# Patient Record
Sex: Female | Born: 1955
Health system: Southern US, Community
[De-identification: ages and names within clinical notes are randomized; demographics above are authoritative.]

## PROBLEM LIST (undated history)

## (undated) DIAGNOSIS — I10 Essential (primary) hypertension: Secondary | ICD-10-CM

## (undated) DIAGNOSIS — E785 Hyperlipidemia, unspecified: Secondary | ICD-10-CM

## (undated) HISTORY — DX: Hyperlipidemia, unspecified: E78.5

---

## 1997-08-09 ENCOUNTER — Ambulatory Visit (HOSPITAL_COMMUNITY): Admission: RE | Admit: 1997-08-09 | Discharge: 1997-08-09 | Payer: Self-pay | Admitting: Obstetrics & Gynecology

## 1998-03-20 ENCOUNTER — Emergency Department (HOSPITAL_COMMUNITY): Admission: EM | Admit: 1998-03-20 | Discharge: 1998-03-20 | Payer: Self-pay | Admitting: Emergency Medicine

## 1998-03-20 ENCOUNTER — Encounter: Payer: Self-pay | Admitting: Emergency Medicine

## 1999-08-16 ENCOUNTER — Emergency Department (HOSPITAL_COMMUNITY): Admission: EM | Admit: 1999-08-16 | Discharge: 1999-08-16 | Payer: Self-pay | Admitting: Emergency Medicine

## 1999-10-10 ENCOUNTER — Ambulatory Visit (HOSPITAL_COMMUNITY): Admission: RE | Admit: 1999-10-10 | Discharge: 1999-10-10 | Payer: Self-pay | Admitting: Obstetrics & Gynecology

## 1999-10-10 ENCOUNTER — Encounter: Payer: Self-pay | Admitting: Obstetrics & Gynecology

## 2001-02-08 ENCOUNTER — Ambulatory Visit (HOSPITAL_COMMUNITY): Admission: RE | Admit: 2001-02-08 | Discharge: 2001-02-08 | Payer: Self-pay | Admitting: Obstetrics & Gynecology

## 2001-02-08 ENCOUNTER — Encounter: Payer: Self-pay | Admitting: Obstetrics & Gynecology

## 2004-05-28 ENCOUNTER — Ambulatory Visit: Payer: Self-pay | Admitting: Family Medicine

## 2004-06-18 ENCOUNTER — Ambulatory Visit: Payer: Self-pay | Admitting: *Deleted

## 2004-06-18 ENCOUNTER — Ambulatory Visit: Payer: Self-pay | Admitting: Family Medicine

## 2004-06-20 ENCOUNTER — Ambulatory Visit: Payer: Self-pay | Admitting: Family Medicine

## 2004-06-23 ENCOUNTER — Ambulatory Visit: Payer: Self-pay | Admitting: Family Medicine

## 2004-06-30 ENCOUNTER — Ambulatory Visit: Payer: Self-pay | Admitting: Family Medicine

## 2004-07-21 ENCOUNTER — Ambulatory Visit: Payer: Self-pay | Admitting: Family Medicine

## 2009-06-12 ENCOUNTER — Ambulatory Visit (HOSPITAL_COMMUNITY): Admission: RE | Admit: 2009-06-12 | Discharge: 2009-06-12 | Payer: Self-pay | Admitting: Family Medicine

## 2012-10-24 ENCOUNTER — Other Ambulatory Visit: Payer: Self-pay | Admitting: Obstetrics and Gynecology

## 2012-10-24 DIAGNOSIS — Z1231 Encounter for screening mammogram for malignant neoplasm of breast: Secondary | ICD-10-CM

## 2012-10-25 ENCOUNTER — Encounter (HOSPITAL_COMMUNITY): Payer: Self-pay

## 2012-10-25 ENCOUNTER — Ambulatory Visit (HOSPITAL_COMMUNITY)
Admission: RE | Admit: 2012-10-25 | Discharge: 2012-10-25 | Disposition: A | Discharge status: Home / Self Care | Payer: Self-pay | Source: Ambulatory Visit | Attending: Obstetrics and Gynecology | Admitting: Obstetrics and Gynecology

## 2012-10-25 ENCOUNTER — Encounter (HOSPITAL_COMMUNITY): Payer: Self-pay | Admitting: Emergency Medicine

## 2012-10-25 ENCOUNTER — Emergency Department (HOSPITAL_COMMUNITY)
Admission: EM | Admit: 2012-10-25 | Discharge: 2012-10-25 | Discharge status: Against Medical Advice | Payer: Self-pay | Attending: Emergency Medicine | Admitting: Emergency Medicine

## 2012-10-25 VITALS — BP 256/146 | Temp 98.4°F | Ht 70.0 in | Wt 210.6 lb

## 2012-10-25 DIAGNOSIS — Z1231 Encounter for screening mammogram for malignant neoplasm of breast: Secondary | ICD-10-CM

## 2012-10-25 DIAGNOSIS — Z01419 Encounter for gynecological examination (general) (routine) without abnormal findings: Secondary | ICD-10-CM

## 2012-10-25 DIAGNOSIS — I10 Essential (primary) hypertension: Secondary | ICD-10-CM | POA: Insufficient documentation

## 2012-10-25 HISTORY — DX: Essential (primary) hypertension: I10

## 2012-10-25 NOTE — ED Notes (Signed)
Called no answer

## 2012-10-25 NOTE — Progress Notes (Signed)
No complaints today.  Pap Smear:    Pap smear completed today. Per patient last Pap smear was 5 years ago and normal. Per patient has no history of an abnormal Pap smear. No Pap smear results in EPIC.  Physical exam: Breasts Left breast larger than right breast. No skin abnormalities bilateral breasts. No nipple retraction bilateral breasts. No nipple discharge bilateral breasts. No lymphadenopathy. No lumps palpated bilateral breasts. No complaints of pain or tenderness on exam. Patient escorted to mammography for a screening mammogram.          Pelvic/Bimanual   Ext Genitalia No lesions, no swelling and no discharge observed on external genitalia.         Vagina Vagina pink and normal texture. No lesions or discharge observed in vagina.          Cervix Cervix is present. Cervix pink and of normal texture. Cervical polyp on cervix. No discharge observed.     Uterus Uterus is present and palpable. Uterus in normal position and normal size.        Adnexae Bilateral ovaries present and palpable. No tenderness on palpation.          Rectovaginal No rectal exam completed today since patient had no rectal complaints. No skin abnormalities observed on exam.

## 2012-10-25 NOTE — Patient Instructions (Addendum)
Taught patient how to perform BSE. Let her know [facility] will cover Pap smears every 3 years unless has a history of abnormal Pap smears. Let patient know will follow up with her within the next couple weeks with results by letter or phone. Told patient to go to the ED at [facility] due to her BP being highly elevated. BP was 256/146 and 280/148. Let patient know have called report to [facility] ED and that they are expecting her. Told her importance of following up on her elevated BP. Patient needs to follow up at [facility] to have a cervical Polyp removed. Told her will discuss when call with her results to Pap smear. Patient verbalized understanding.

## 2012-10-25 NOTE — ED Notes (Addendum)
Called x 3, no answer.  

## 2012-10-25 NOTE — ED Notes (Signed)
Pt arrives sent over from [facility] for pts HTN noted prior to getting a mamogram done.  Pt reports not taking medications for HTN since she doesn't have any health insurance to see a doctor. Denies headache, blurred vision, dizziness. Pt alert oriented x4, NAD at present.

## 2012-11-17 ENCOUNTER — Telehealth (HOSPITAL_COMMUNITY): Payer: Self-pay | Admitting: *Deleted

## 2012-11-17 NOTE — Telephone Encounter (Signed)
Telephoned patient at home # and left message to return call to [facility]

## 2012-11-18 ENCOUNTER — Telehealth (HOSPITAL_COMMUNITY): Payer: Self-pay | Admitting: *Deleted

## 2012-11-18 NOTE — Telephone Encounter (Signed)
Telephoned patient at home # and left message to return call to [facility] 

## 2012-11-21 ENCOUNTER — Telehealth (HOSPITAL_COMMUNITY): Payer: Self-pay | Admitting: *Deleted

## 2012-11-21 NOTE — Telephone Encounter (Signed)
Telephoned patient at home # and left message. Also left message with emergency contact.

## 2012-11-22 ENCOUNTER — Telehealth (HOSPITAL_COMMUNITY): Payer: Self-pay | Admitting: *Deleted

## 2012-11-22 NOTE — Telephone Encounter (Signed)
Telephoned patient at home # and advised of normal pap smear results. Next pap due in 3 years. Gave patient appointment at [facility] for Thursday July 31 2:45. Patient voiced understanding.

## 2012-12-22 ENCOUNTER — Encounter: Payer: Self-pay | Admitting: Obstetrics & Gynecology

## 2013-02-14 ENCOUNTER — Ambulatory Visit: Payer: Self-pay | Admitting: Internal Medicine

## 2013-02-20 ENCOUNTER — Ambulatory Visit (INDEPENDENT_AMBULATORY_CARE_PROVIDER_SITE_OTHER): Payer: Managed Care, Other (non HMO) | Admitting: Internal Medicine

## 2013-02-20 ENCOUNTER — Other Ambulatory Visit (INDEPENDENT_AMBULATORY_CARE_PROVIDER_SITE_OTHER): Payer: Managed Care, Other (non HMO)

## 2013-02-20 ENCOUNTER — Encounter: Payer: Self-pay | Admitting: Internal Medicine

## 2013-02-20 VITALS — BP 160/100 | HR 82 | Temp 98.6°F | Ht 71.0 in | Wt 207.0 lb

## 2013-02-20 DIAGNOSIS — Z13220 Encounter for screening for lipoid disorders: Secondary | ICD-10-CM

## 2013-02-20 DIAGNOSIS — Z0000 Encounter for general adult medical examination without abnormal findings: Secondary | ICD-10-CM

## 2013-02-20 DIAGNOSIS — I10 Essential (primary) hypertension: Secondary | ICD-10-CM

## 2013-02-20 DIAGNOSIS — Z130 Encounter for screening for diseases of the blood and blood-forming organs and certain disorders involving the immune mechanism: Secondary | ICD-10-CM

## 2013-02-20 DIAGNOSIS — Z1321 Encounter for screening for nutritional disorder: Secondary | ICD-10-CM

## 2013-02-20 DIAGNOSIS — Z131 Encounter for screening for diabetes mellitus: Secondary | ICD-10-CM

## 2013-02-20 DIAGNOSIS — Z23 Encounter for immunization: Secondary | ICD-10-CM

## 2013-02-20 DIAGNOSIS — R011 Cardiac murmur, unspecified: Secondary | ICD-10-CM

## 2013-02-20 DIAGNOSIS — Z1329 Encounter for screening for other suspected endocrine disorder: Secondary | ICD-10-CM

## 2013-02-20 LAB — CBC
HCT: 36.5 % (ref 36.0–46.0)
Hemoglobin: 12.3 g/dL (ref 12.0–15.0)
MCHC: 33.6 g/dL (ref 30.0–36.0)
MCV: 88.9 fl (ref 78.0–100.0)
Platelets: 153 10*3/uL (ref 150.0–400.0)
RBC: 4.1 Mil/uL (ref 3.87–5.11)
RDW: 14.6 % (ref 11.5–14.6)
WBC: 4.4 10*3/uL — ABNORMAL LOW (ref 4.5–10.5)

## 2013-02-20 LAB — COMPREHENSIVE METABOLIC PANEL
ALT: 15 U/L (ref 0–35)
AST: 21 U/L (ref 0–37)
Albumin: 4 g/dL (ref 3.5–5.2)
Alkaline Phosphatase: 64 U/L (ref 39–117)
BUN: 18 mg/dL (ref 6–23)
CO2: 29 mEq/L (ref 19–32)
Calcium: 9.2 mg/dL (ref 8.4–10.5)
Chloride: 107 mEq/L (ref 96–112)
Creatinine, Ser: 0.8 mg/dL (ref 0.4–1.2)
GFR: 89.61 mL/min (ref >60.00)
Glucose, Bld: 90 mg/dL (ref 70–99)
Potassium: 4.1 mEq/L (ref 3.5–5.1)
Sodium: 140 mEq/L (ref 135–145)
Total Bilirubin: 0.5 mg/dL (ref 0.3–1.2)
Total Protein: 7.4 g/dL (ref 6.0–8.3)

## 2013-02-20 LAB — LIPID PANEL
Cholesterol: 183 mg/dL (ref 0–200)
HDL: 46.2 mg/dL (ref >39.00)
LDL Cholesterol: 123 mg/dL — ABNORMAL HIGH (ref 0–99)
Total CHOL/HDL Ratio: 4
Triglycerides: 71 mg/dL (ref 0.0–149.0)
VLDL: 14.2 mg/dL (ref 0.0–40.0)

## 2013-02-20 LAB — TSH: TSH: 0.78 u[IU]/mL (ref 0.35–5.50)

## 2013-02-20 LAB — HEMOGLOBIN A1C: Hgb A1c MFr Bld: 5.7 % (ref 4.6–6.5)

## 2013-02-20 MED ORDER — LOSARTAN POTASSIUM-HCTZ 50-12.5 MG PO TABS: 1.0000 | ORAL_TABLET | Freq: Every day | ORAL | Status: DC

## 2013-02-20 NOTE — Progress Notes (Signed)
HPI Pt presents to the clinic today to establish care. She is not transferring care from another provider. She has not had insurance in a few years. She has no concerns today.  Flu: never, but does plan to get one today Tetanus: not within last 10 years LMP: postmenaousal Pap smear: 10/2012 Mammogram: 10/2012 Colonoscopy: never had one Eye doctor: once per year Dentist: scheduled  Past Medical History  Diagnosis Date  . Hypertension   . Hyperlipidemia     No current outpatient prescriptions on file.   No current facility-administered medications for this visit.    No Known Allergies  Family History  Problem Relation Age of Onset  . Hypertension Mother   . Hypertension Maternal Grandmother   . Hypertension Maternal Grandfather     History   Social History  . Marital Status: Divorced    Spouse Name: N/A    Number of Children: N/A  . Years of Education: N/A   Occupational History  . Not on file.   Social History Main Topics  . Smoking status: Never Smoker   . Smokeless tobacco: Never Used  . Alcohol Use: No  . Drug Use: No  . Sexual Activity: Not Currently   Other Topics Concern  . Not on file   Social History Narrative  . No narrative on file    ROS:  Constitutional: Denies fever, malaise, fatigue, headache or abrupt weight changes.  HEENT: Denies eye pain, eye redness, ear pain, ringing in the ears, wax buildup, runny nose, nasal congestion, bloody nose, or sore throat. Respiratory: Denies difficulty breathing, shortness of breath, cough or sputum production.   Cardiovascular: Denies chest pain, chest tightness, palpitations or swelling in the hands or feet.  Gastrointestinal: Denies abdominal pain, bloating, constipation, diarrhea or blood in the stool.  GU: Denies frequency, urgency, pain with urination, blood in urine, odor or discharge. Musculoskeletal: Denies decrease in range of motion, difficulty with gait, muscle pain or joint pain and swelling.   Skin: Denies redness, rashes, lesions or ulcercations.  Neurological: Denies dizziness, difficulty with memory, difficulty with speech or problems with balance and coordination.   No other specific complaints in a complete review of systems (except as listed in HPI above).  PE:  BP 160/100  Pulse 82  Temp(Src) 98.6 F (37 C) (Oral)  Ht 5\' 11"  (1.803 m)  Wt 207 lb (93.895 kg)  BMI 28.88 kg/m2  SpO2 97% Wt Readings from Last 3 Encounters:  02/20/13 207 lb (93.895 kg)  10/25/12 210 lb 9.6 oz (95.528 kg)    General: Appears her stated age, overweight but well developed, well nourished in NAD. HEENT: Head: normal shape and size; Eyes: sclera white, no icterus, conjunctiva pink, PERRLA and EOMs intact; Ears: Tm's gray and intact, normal light reflex; Nose: mucosa pink and moist, septum midline; Throat/Mouth: Teeth present, mucosa pink and moist, no lesions or ulcerations noted.  Neck: Normal range of motion. Neck supple, trachea midline. No massses, lumps or thyromegaly present.  Cardiovascular: Normal rate and rhythm. S1,S2 noted.  Murmur noted, no rubs or gallops noted. Marked JVD noted on the right side. No BLE edema. No carotid bruits noted. Pulmonary/Chest: Normal effort and positive vesicular breath sounds. No respiratory distress. No wheezes, rales or ronchi noted.  Abdomen: Soft and nontender. Normal bowel sounds, no bruits noted. No distention or masses noted. Liver, spleen and kidneys non palpable. Musculoskeletal: Normal range of motion. No signs of joint swelling. No difficulty with gait.  Neurological: Alert and oriented. Cranial  nerves II-XII intact. Coordination normal. +DTRs bilaterally. Psychiatric: Mood and affect normal. Behavior is normal. Judgment and thought content normal.     Assessment and Plan:  Prevent health:  Flu and tdap given today Will obtain basic screening labs

## 2013-02-20 NOTE — Assessment & Plan Note (Signed)
With marked JVD on right side Will obtain 2D echo  May need referral to cardiology

## 2013-02-20 NOTE — Assessment & Plan Note (Signed)
Elevated Not currently medicated Will start Hyzaar  RTC in 1 month for follow up

## 2013-02-20 NOTE — Patient Instructions (Signed)

## 2013-02-21 ENCOUNTER — Other Ambulatory Visit: Payer: Self-pay | Admitting: Internal Medicine

## 2013-02-21 LAB — VITAMIN D 25 HYDROXY (VIT D DEFICIENCY, FRACTURES): Vit D, 25-Hydroxy: 13 ng/mL — ABNORMAL LOW (ref 30–89)

## 2013-02-21 MED ORDER — VITAMIN D (ERGOCALCIFEROL) 1.25 MG (50000 UNIT) PO CAPS: 50000.0000 [IU] | ORAL_CAPSULE | ORAL | Status: DC

## 2013-03-20 ENCOUNTER — Ambulatory Visit: Payer: Managed Care, Other (non HMO) | Admitting: Internal Medicine

## 2013-03-20 DIAGNOSIS — Z0289 Encounter for other administrative examinations: Secondary | ICD-10-CM

## 2013-04-27 ENCOUNTER — Encounter: Payer: Self-pay | Admitting: Interventional Cardiology

## 2013-04-28 ENCOUNTER — Encounter: Payer: Self-pay | Admitting: Interventional Cardiology

## 2013-04-28 ENCOUNTER — Ambulatory Visit (INDEPENDENT_AMBULATORY_CARE_PROVIDER_SITE_OTHER): Payer: Managed Care, Other (non HMO) | Admitting: Interventional Cardiology

## 2013-04-28 ENCOUNTER — Encounter (INDEPENDENT_AMBULATORY_CARE_PROVIDER_SITE_OTHER): Payer: Self-pay

## 2013-04-28 VITALS — BP 144/72 | HR 74 | Ht 71.0 in | Wt 203.8 lb

## 2013-04-28 DIAGNOSIS — I359 Nonrheumatic aortic valve disorder, unspecified: Secondary | ICD-10-CM

## 2013-04-28 DIAGNOSIS — E785 Hyperlipidemia, unspecified: Secondary | ICD-10-CM

## 2013-04-28 DIAGNOSIS — I10 Essential (primary) hypertension: Secondary | ICD-10-CM

## 2013-04-28 NOTE — Progress Notes (Signed)
Patient ID: Arine Foley, female   DOB: 30-May-1955, 57 y.o.   MRN: [id number]   Date: 04/28/2013 ID: Lasandra Beech, DOB [birth year], MRN [id number] PCP: Jinny Blossom Reason: Murmur  ASSESSMENT;  1. Aortic stenosis is the most likely source of the patient's murmur. Cannot exclude the possibility of congenital VSD. 2. Hypertension, marginal control 3. Obesity 4. Hyperlipidemia  PLAN:  1. 2-D Doppler echocardiogram with further instructions pending results.   SUBJECTIVE: Eathel Pajak is a 57 y.o. female who is a very pleasant 56 year old African American female who is referred by Dr. Alfredia Ferguson for evaluation of systolic murmur. The patient states that she was informed of having a heart murmur within the last 5-10 years. He has never been evaluated because she has not had health insurance will allow her to afford the workup. She does have exertional dyspnea. She feels that it is appropriate for her level of conditioning. She denies chest discomfort, orthopnea, PND, edema, and syncope. She has no other significant medical problems and hypertension.   No Known Allergies  Current Outpatient Prescriptions on File Prior to Visit  Medication Sig Dispense Refill  . losartan-hydrochlorothiazide (HYZAAR) 50-12.5 MG per tablet Take 1 tablet by mouth daily.  30 tablet  3   No current facility-administered medications on file prior to visit.    Past Medical History  Diagnosis Date  . Hypertension   . Hyperlipidemia     No past surgical history on file.  History   Social History  . Marital Status: Divorced    Spouse Name: N/A    Number of Children: N/A  . Years of Education: N/A   Occupational History  . Not on file.   Social History Main Topics  . Smoking status: Never Smoker   . Smokeless tobacco: Never Used  . Alcohol Use: No  . Drug Use: No  . Sexual Activity: Not Currently   Other Topics Concern  . Not on file   Social History Narrative  . No narrative on file    Family  History  Problem Relation Age of Onset  . Hypertension Mother   . Hypertension Maternal Grandmother   . Hypertension Maternal Grandfather     ROS: Denies stroke, asthma, liver disease, ulcer, intestinal bleeding, kidney disease, diabetes, rheumatic fever, and weight loss.. Other systems negative for complaints.  OBJECTIVE: BP 144/72  Pulse 74  Ht 5\' 11"  (1.803 m)  Wt 203 lb 12.8 oz (92.443 kg)  BMI 28.44 kg/m2,   General: No acute distress, appearing younger than stated age HEENT: normal Neck: JVD flat. Carotids right greater than left bruit transmitted from aorta Chest: Clear to patient and percussion Cardiac: Murmur: 3/6 crescendo decrescendo murmur of aortic stenosis. No change with maneuvers.. Gallop: S4 gallop. Rhythm: Regular. Other: Unable to palpate PMI Abdomen: Bruit: Absent. Pulsation: Absent Extremities: Edema: Absent. Pulses: 2+ and bounding Neuro: Normal Psych: Marked anxiety  ECG: Normal sinus rhythm with LVH and strain.

## 2013-04-28 NOTE — Patient Instructions (Addendum)
Your physician recommends that you continue on your current medications as directed. Please refer to the Current Medication list given to you today.  Your physician has requested that you have an echocardiogram. Echocardiography is a painless test that uses sound waves to create images of your heart. It provides your doctor with information about the size and shape of your heart and how well your heart's chambers and valves are working. This procedure takes approximately one hour. There are no restrictions for this procedure. Echo scheduled 05/16/13 @10 :30am  Follow up pending echo results

## 2013-05-16 ENCOUNTER — Encounter: Payer: Self-pay | Admitting: Cardiology

## 2013-05-16 ENCOUNTER — Ambulatory Visit (HOSPITAL_COMMUNITY): Payer: Managed Care, Other (non HMO) | Attending: Internal Medicine | Admitting: Radiology

## 2013-05-16 DIAGNOSIS — I10 Essential (primary) hypertension: Secondary | ICD-10-CM | POA: Insufficient documentation

## 2013-05-16 DIAGNOSIS — E785 Hyperlipidemia, unspecified: Secondary | ICD-10-CM | POA: Insufficient documentation

## 2013-05-16 DIAGNOSIS — R011 Cardiac murmur, unspecified: Secondary | ICD-10-CM | POA: Insufficient documentation

## 2013-05-16 DIAGNOSIS — I359 Nonrheumatic aortic valve disorder, unspecified: Secondary | ICD-10-CM | POA: Insufficient documentation

## 2013-05-16 NOTE — Progress Notes (Signed)
Echocardiogram performed.  

## 2013-05-22 ENCOUNTER — Telehealth: Payer: Self-pay

## 2013-05-22 NOTE — Telephone Encounter (Signed)
Message copied by Jarvis Newcomer on Mon May 22, 2013  4:02 PM ------      Message from: Verdis Prime      Created: Tue May 16, 2013  5:46 PM       Dynamic LV outflow obstruction with gradient > 100 mmHg. Need to add Metoprolol succinate 50 mg daily. Will eventually try to get rid of the fluid medicine in her other BP med. Needs a f/u appt in 2 months. ------

## 2013-05-22 NOTE — Telephone Encounter (Signed)
called to give pt echo results and Dr.Smith instructions.lmom for pt to return call

## 2013-05-22 NOTE — Telephone Encounter (Signed)
Follow Up  Pt returned call// Will not be free until 6:30// Please call back please and leave a message.

## 2013-05-23 MED ORDER — METOPROLOL SUCCINATE ER 50 MG PO TB24: 50.0000 mg | ORAL_TABLET | Freq: Every day | ORAL | Status: DC

## 2013-05-23 NOTE — Telephone Encounter (Signed)
Message copied by Jarvis Newcomer on Tue May 23, 2013 10:37 AM ------      Message from: Verdis Prime      Created: Tue May 16, 2013  5:46 PM       Dynamic LV outflow obstruction with gradient > 100 mmHg. Need to add Metoprolol succinate 50 mg daily. Will eventually try to get rid of the fluid medicine in her other BP med. Needs a f/u appt in 2 months. ------

## 2013-05-23 NOTE — Telephone Encounter (Signed)
2nd attempt. pt sts ok to leave detailed message.echo results.Dynamic LV outflow obstruction with gradient > 100 mmHg. Need to add Metoprolol succinate 50 mg daily.Rx sent to pharmacy. Will eventually try to get rid of the fluid medicine in her other BP med. Needs a f/u app in 2 months.pt to call the office with any additional questions and to schedule f/u appt.

## 2013-05-24 ENCOUNTER — Telehealth: Payer: Self-pay | Admitting: Interventional Cardiology

## 2013-05-24 NOTE — Telephone Encounter (Signed)
returned pt call.pt adv that Dr.Smith is aware that she currently taking Hyzaar and and he would like her to start metoprolol in addition to Hyzaar.pt verbalized understanding

## 2013-05-24 NOTE — Telephone Encounter (Signed)
New Message  Pt returning your call// Medication inquiry about BP meds/// she asks if Metoprolol can be taken with other BP medications.. Please call.

## 2013-07-21 ENCOUNTER — Encounter: Payer: Self-pay | Admitting: Interventional Cardiology

## 2013-07-21 ENCOUNTER — Ambulatory Visit (INDEPENDENT_AMBULATORY_CARE_PROVIDER_SITE_OTHER): Payer: BC Managed Care – PPO | Admitting: Interventional Cardiology

## 2013-07-21 VITALS — BP 160/99 | HR 83 | Ht 71.0 in | Wt 202.0 lb

## 2013-07-21 DIAGNOSIS — E785 Hyperlipidemia, unspecified: Secondary | ICD-10-CM

## 2013-07-21 DIAGNOSIS — I10 Essential (primary) hypertension: Secondary | ICD-10-CM

## 2013-07-21 DIAGNOSIS — I421 Obstructive hypertrophic cardiomyopathy: Secondary | ICD-10-CM

## 2013-07-21 NOTE — Patient Instructions (Signed)
Your physician recommends that you continue on your current medications as directed. Please refer to the Current Medication list given to you today.  Call the office if you ae experiencing the following symptoms: shortness of breath, chest pain, syncope(fainting)  Your physician wants you to follow-up in: 6 months You will receive a reminder letter in the mail two months in advance. If you don't receive a letter, please call our office to schedule the follow-up appointment.

## 2013-07-21 NOTE — Progress Notes (Signed)
     [address] [place], [place]  [address] Phone: [phone] Fax:  [phone]  Date:  07/21/2013   ID:  Heather Abbott, DOB 12/27/1955, MRN [id number]  PCP:  Nicki ReaperBAITY, REGINA, NP   ASSESSMENT:  1. Hypertrophic obstructive cardiomyopathy, asymptomatic, manifesting clinically has a loud systolic murmur. Documented by echocardiography in late 2014 2. Hypertension, poor control 3. Hyperlipidemia   PLAN:  1. Beta blocker therapy was added and has been well tolerated. 2. We talked about the pathophysiology and natural history including symptoms related to HOCM 3. She understands that we will probably need to remove her current diuretic, but we decided at this time since she is asymptomatic to continue her birth same therapy. I have recommended that she take both Hyzaar and metoprolol each morning rather than taking the Hyzaar at night. 4. She understands that she should call if dyspnea, chest pain, or syncope per   SUBJECTIVE: Heather Abbott is a 58 y.o. female who has no complaints. She saw me because of a heart murmur. I initially felt that she had aortic stenosis. The echo demonstrated that she has LV outflow tract obstruction there is dynamic. She does have concentric hypertrophy. The measured LV outflow velocity is 5 m/s. Over the past 3-5 days she has experienced some early morning chest burning. There is no exertional component.. She denies dyspnea, orthopnea, PND, orthopnea, syncope, PND, and palpitations   Wt Readings from Last 3 Encounters:  07/21/13 202 lb (91.627 kg)  04/28/13 203 lb 12.8 oz (92.443 kg)  02/20/13 207 lb (93.895 kg)     Past Medical History  Diagnosis Date  . Hypertension   . Hyperlipidemia     Current Outpatient Prescriptions  Medication Sig Dispense Refill  . losartan-hydrochlorothiazide (HYZAAR) 100-12.5 MG per tablet Take 1 tablet by mouth daily.      . metoprolol succinate (TOPROL-XL) 50 MG 24 hr tablet Take 1 tablet (50 mg total)  by mouth daily. Take with or immediately following a meal.  30 tablet  11   No current facility-administered medications for this visit.    Allergies:   No Known Allergies  Social History:  The patient  reports that she has never smoked. She has never used smokeless tobacco. She reports that she does not drink alcohol or use illicit drugs.   ROS:  Please see the history of present illness.   Stable appetite, denies chills and fever, no edema, never had syncope per   All other systems reviewed and negative.   OBJECTIVE: VS:  BP 160/99  Pulse 83  Ht 5\' 11"  (1.803 m)  Wt 202 lb (91.627 kg)  BMI 28.19 kg/m2 Well nourished, well developed, in no acute distress, younger than stated age HEENT: normal Neck: JVD flat. Carotid bruit absent  Cardiac:  normal S1, S2; RRR; 3/6 crescendo left mid parasternal border systolic murmu related to LV outflow obstruction. With water from sitting to standing the murmur increases in intensity. An S4 gallop is also audible. Lungs:  clear to auscultation bilaterally, no wheezing, rhonchi or rales Abd: soft, nontender, no hepatomegaly Ext: Edema absent. Pulses 2+ and symmetric Skin: warm and dry Neuro:  CNs 2-12 intact, no focal abnormalities noted  EKG:  Not repeated   . Prior tracing reveals left ventricular hypertrophy with strain.    Signed, Darci NeedleHenry W. B. Smith III, MD 07/21/2013 4:00 PM

## 2013-07-31 ENCOUNTER — Telehealth: Payer: Self-pay | Admitting: Interventional Cardiology

## 2013-07-31 NOTE — Telephone Encounter (Signed)
New Message  Pt called back to inform Dr. Katrinka BlazingSmith that her sympotoms have not changed and she is requesting a call back to discuss BP medication adjustments // Please assist//SR

## 2013-08-01 NOTE — Telephone Encounter (Signed)
returned pt call.lmom  for pt to call back 

## 2013-08-08 ENCOUNTER — Telehealth: Payer: Self-pay | Admitting: Interventional Cardiology

## 2013-08-08 NOTE — Telephone Encounter (Signed)
Follow up     Patient calling asking does Dr. Katrinka BlazingSmith think she need to come in for an appointment.    Patient stated A lot of times at work  she does not answer her phone.     Can leave a voice mail.

## 2013-08-08 NOTE — Telephone Encounter (Signed)
returned pt call.pt sts that she see had an appt with her pcp yesterday for dyspnea.pt sts that she had lab work and an chest xray.she sts she was told by her pcp she has a lung infection and is at risk for "heart failure" she sts there was a message left on her machine for her to go to the ED, she did not.Marland Kitchen.adv her that Dr.Nnody did call the office late yesterday afternoon to talk with Dr.Smith who had not arrived to the office yet.she wanted to make sure that Dr.Smith was in the loop.adv her that Dr.Smith will not be in the office until late this afternoon.she should follow the directions of Dr.Nnody and call their office to talk with her because Dr.Smith is out of the office..I will fwd a message to Dr.Smith.pt verbalized understanding.

## 2013-08-08 NOTE — Telephone Encounter (Signed)
detailed message lmom.pt appt made to see Dr.Smithn on 08/15/13 @8 :45.pt to call if she cannot keep the appt, and if symptoms get worse.

## 2013-08-08 NOTE — Telephone Encounter (Signed)
New message     Pt has a lung inf and is being treated by Dr Ihor DowNnodi at [facility] [place].  Labs revealed that the patient is at risk for heart failure. Want to talk to Dr Katrinka BlazingSmith or a nurse.

## 2013-08-15 ENCOUNTER — Encounter: Payer: Self-pay | Admitting: Interventional Cardiology

## 2013-08-15 ENCOUNTER — Ambulatory Visit (INDEPENDENT_AMBULATORY_CARE_PROVIDER_SITE_OTHER): Payer: BC Managed Care – PPO | Admitting: Interventional Cardiology

## 2013-08-15 VITALS — BP 150/98 | HR 82 | Ht 71.0 in | Wt 206.4 lb

## 2013-08-15 DIAGNOSIS — I5032 Chronic diastolic (congestive) heart failure: Secondary | ICD-10-CM | POA: Insufficient documentation

## 2013-08-15 DIAGNOSIS — I421 Obstructive hypertrophic cardiomyopathy: Secondary | ICD-10-CM

## 2013-08-15 DIAGNOSIS — I10 Essential (primary) hypertension: Secondary | ICD-10-CM

## 2013-08-15 DIAGNOSIS — E785 Hyperlipidemia, unspecified: Secondary | ICD-10-CM

## 2013-08-15 MED ORDER — VERAPAMIL HCL ER 180 MG PO TBCR: 180.0000 mg | EXTENDED_RELEASE_TABLET | Freq: Every day | ORAL | Status: DC

## 2013-08-15 MED ORDER — METOPROLOL SUCCINATE ER 25 MG PO TB24: 25.0000 mg | ORAL_TABLET | Freq: Every day | ORAL | Status: DC

## 2013-08-15 NOTE — Progress Notes (Signed)
Patient ID: Heather Abbott Leonhart, female   DOB: 02/25/1956, 58 y.o.   MRN: [id number]    [address] [place], [place]  [address] Phone: [phone] Fax:  [phone]  Date:  08/15/2013   ID:  Heather Abbott Luhmann, DOB 07/08/1955, MRN [id number]  PCP:  Nicki ReaperBAITY, REGINA, NP   ASSESSMENT:  1. Hypertrophic cardiomyopathy 2. Diastolic heart failure, aggravated by discontinuation of hydrochlorothiazide and after the addition of metoprolol 3. Hypertension, poorly controlled  PLAN:  1. Hyzaar was resumed greater than 10 days ago after briefly being discontinued 2. Decrease metoprolol succinate 25 mg daily and then discontinue after one week 3. Verapamil SR 180 mg daily 4. Office visit in 4-6 weeks to followup on blood pressure and clinical condition   SUBJECTIVE: Heather Abbott Barbar is a 58 y.o. female who has hypertrophic cardiomyopathy and poorly controlled or pressure. She has LV outflow tract obstruction. We made medication adjustments to fit her physiology. We added metoprolol succinate 50 mg per day. I mentioned discontinuing her diuretic. This was done by Dr.Knodi. After HCTZ was discontinued lower extremity swelling, orthopnea, and dyspnea on exertion began. Approximately one week ago we resumed HCTZ 12.5 mg per day and the dyspnea and lower extremity swelling has gradually improved although she is not back to baseline. She denies wheezing. She has not had palpitations or syncope.   Wt Readings from Last 3 Encounters:  08/15/13 206 lb 6.4 oz (93.622 kg)  07/21/13 202 lb (91.627 kg)  04/28/13 203 lb 12.8 oz (92.443 kg)     Past Medical History  Diagnosis Date  . Hypertension   . Hyperlipidemia     Current Outpatient Prescriptions  Medication Sig Dispense Refill  . Albuterol Sulfate (PROAIR HFA IN) Inhale into the lungs.      Marland Kitchen. losartan-hydrochlorothiazide (HYZAAR) 100-25 MG per tablet Take 1 tablet by mouth daily.      . metoprolol succinate (TOPROL-XL) 50 MG 24 hr tablet Take 1  tablet (50 mg total) by mouth daily. Take with or immediately following a meal.  30 tablet  11   No current facility-administered medications for this visit.    Allergies:   No Known Allergies  Social History:  The patient  reports that she has never smoked. She has never used smokeless tobacco. She reports that she does not drink alcohol or use illicit drugs.   ROS:  Please see the history of present illness.      All other systems reviewed and negative.   OBJECTIVE: VS:  BP 150/98  Pulse 82  Ht 5\' 11"  (1.803 m)  Wt 206 lb 6.4 oz (93.622 kg)  BMI 28.80 kg/m2 Well nourished, well developed, in no acute distress, appears than his stated age HEENT: normal Neck: JVD flat. Carotid bruit absent  Cardiac:  normal S1, S2; RRR; 3/6 systolic murmur left midsternal border Lungs:  clear to auscultation bilaterally, no wheezing, rhonchi or rales Abd: soft, nontender, no hepatomegaly Ext: Edema absent. Pulses 2+ and symmetric Skin: warm and dry Neuro:  CNs 2-12 intact, no focal abnormalities noted  EKG: Normal sinus rhythm with marked left ventricular hypertrophy and secondary repolarization abnormality. Left atrial abnormality is also noted to    Signed, Darci NeedleHenry W. B. Vallery Mcdade III, MD 08/15/2013 9:01 AM

## 2013-08-15 NOTE — Patient Instructions (Signed)
Your physician has recommended you make the following change in your medication:  1)DECREASE Metoprolol to 25mg  daily for 1 week then stop 2)START Verapamil 180mg  CR daily after discontinuing Metoprolol. An Rx has been sent to your pharmacy Take all other medications as prescribed  You have a follow up appt scheduled for 09/19/13 @11 :15am

## 2013-09-19 ENCOUNTER — Encounter: Payer: Self-pay | Admitting: Interventional Cardiology

## 2013-09-19 ENCOUNTER — Ambulatory Visit (INDEPENDENT_AMBULATORY_CARE_PROVIDER_SITE_OTHER): Payer: BC Managed Care – PPO | Admitting: Interventional Cardiology

## 2013-09-19 VITALS — BP 142/85 | HR 69 | Ht 71.0 in | Wt 203.0 lb

## 2013-09-19 DIAGNOSIS — I421 Obstructive hypertrophic cardiomyopathy: Secondary | ICD-10-CM

## 2013-09-19 DIAGNOSIS — I10 Essential (primary) hypertension: Secondary | ICD-10-CM

## 2013-09-19 DIAGNOSIS — R002 Palpitations: Secondary | ICD-10-CM

## 2013-09-19 DIAGNOSIS — I5032 Chronic diastolic (congestive) heart failure: Secondary | ICD-10-CM

## 2013-09-19 NOTE — Progress Notes (Signed)
Patient ID: Heather Abbott, female   DOB: 07/24/1955, 58 y.o.   MRN: [id number]    [address] [place], [place]  [address] Phone: [phone] Fax:  [phone]  Date:  09/19/2013   ID:  Heather Abbott, DOB 10/22/1955, MRN [id number]  PCP:  Nicki ReaperBAITY, REGINA, NP   ASSESSMENT:  1. Hypertrophic cardiomyopathy 2. Palpitations 3. Hypertension  PLAN:  1. tolerating verapamil without complications 2. 48-hour Holter to rule out ventricular arrhythmia given the complaint of palpitations 3. Blood pressure is better controlled. 4. Clinical followup in 6 months   SUBJECTIVE: Heather Abbott is a 58 y.o. female who now states the dyspnea has improved. She denies orthopnea PND. She has occasional palpitations but these are not prolonged. She denies chest pain. She has not had syncope.   Wt Readings from Last 3 Encounters:  09/19/13 203 lb (92.08 kg)  08/15/13 206 lb 6.4 oz (93.622 kg)  07/21/13 202 lb (91.627 kg)     Past Medical History  Diagnosis Date  . Hypertension   . Hyperlipidemia     Current Outpatient Prescriptions  Medication Sig Dispense Refill  . Albuterol Sulfate (PROAIR HFA IN) Inhale into the lungs.      Marland Kitchen. losartan-hydrochlorothiazide (HYZAAR) 100-25 MG per tablet Take 1 tablet by mouth daily.      . verapamil (CALAN-SR) 180 MG CR tablet Take 1 tablet (180 mg total) by mouth at bedtime.  30 tablet  11   No current facility-administered medications for this visit.    Allergies:   No Known Allergies  Social History:  The patient  reports that she has never smoked. She has never used smokeless tobacco. She reports that she does not drink alcohol or use illicit drugs.   ROS:  Please see the history of present illness.   Palpitations   All other systems reviewed and negative.   OBJECTIVE: VS:  BP 160/85  Pulse 69  Ht 5\' 11"  (1.803 m)  Wt 203 lb (92.08 kg)  BMI 28.33 kg/m2 Well nourished, well developed, in no acute distress, appears her stated age    HEENT: normal Neck: JVD flat. Carotid bruit absent  Cardiac:  normal S1, S2; RRR; 2-3/6 crescendo murmur right upper sternal and left lower sternal border Lungs:  clear to auscultation bilaterally, no wheezing, rhonchi or rales Abd: soft, nontender, no hepatomegaly Ext: Edema absent. Pulses 2+ Skin: warm and dry Neuro:  CNs 2-12 intact, no focal abnormalities noted  EKG:  Not repeated       Signed, Darci NeedleHenry W. B. Lunabella Badgett III, MD 09/19/2013 11:58 AM

## 2013-09-19 NOTE — Patient Instructions (Signed)
Your physician recommends that you continue on your current medications as directed. Please refer to the Current Medication list given to you today.  Your physician has recommended that you wear a holter monitor. Holter monitors are medical devices that record the heart's electrical activity. Doctors most often use these monitors to diagnose arrhythmias. Arrhythmias are problems with the speed or rhythm of the heartbeat. The monitor is a small, portable device. You can wear one while you do your normal daily activities. This is usually used to diagnose what is causing palpitations/syncope (passing out).  Your physician wants you to follow-up in: 6 months You will receive a reminder letter in the mail two months in advance. If you don't receive a letter, please call our office to schedule the follow-up appointment.  

## 2013-09-27 ENCOUNTER — Other Ambulatory Visit: Payer: Self-pay | Admitting: *Deleted

## 2013-09-27 MED ORDER — LOSARTAN POTASSIUM-HCTZ 100-25 MG PO TABS: 1.0000 | ORAL_TABLET | Freq: Every day | ORAL | Status: DC

## 2013-11-23 ENCOUNTER — Other Ambulatory Visit: Payer: Self-pay

## 2013-11-23 DIAGNOSIS — I421 Obstructive hypertrophic cardiomyopathy: Secondary | ICD-10-CM

## 2013-11-23 MED ORDER — VERAPAMIL HCL ER 180 MG PO TBCR: 180.0000 mg | EXTENDED_RELEASE_TABLET | Freq: Every day | ORAL | Status: DC

## 2013-12-13 ENCOUNTER — Other Ambulatory Visit (HOSPITAL_COMMUNITY): Payer: Self-pay | Admitting: Family Medicine

## 2013-12-13 DIAGNOSIS — Z1231 Encounter for screening mammogram for malignant neoplasm of breast: Secondary | ICD-10-CM

## 2013-12-19 ENCOUNTER — Ambulatory Visit (HOSPITAL_COMMUNITY)
Admission: RE | Admit: 2013-12-19 | Discharge: 2013-12-19 | Disposition: A | Discharge status: Home / Self Care | Payer: BC Managed Care – PPO | Source: Ambulatory Visit | Attending: Family Medicine | Admitting: Family Medicine

## 2013-12-19 DIAGNOSIS — Z1231 Encounter for screening mammogram for malignant neoplasm of breast: Secondary | ICD-10-CM | POA: Insufficient documentation

## 2014-02-03 ENCOUNTER — Other Ambulatory Visit: Payer: Self-pay | Admitting: Interventional Cardiology

## 2014-02-26 ENCOUNTER — Encounter: Payer: Self-pay | Admitting: Interventional Cardiology

## 2014-02-26 ENCOUNTER — Ambulatory Visit (INDEPENDENT_AMBULATORY_CARE_PROVIDER_SITE_OTHER): Payer: BC Managed Care – PPO | Admitting: Interventional Cardiology

## 2014-02-26 VITALS — BP 128/86 | HR 79 | Ht 70.5 in | Wt 209.0 lb

## 2014-02-26 DIAGNOSIS — I421 Obstructive hypertrophic cardiomyopathy: Secondary | ICD-10-CM

## 2014-02-26 NOTE — Progress Notes (Signed)
Patient ID: Heather Abbott, female   DOB: 05/22/1956, 58 y.o.   MRN: [id number]    [address] [place], [place]  [address] Phone: [phone]) [phone] Fax:  [phone]  Date:  02/26/2014   ID:  Heather Abbott, DOB 06/19/1955, MRN [id number]  PCP:  Nicki ReaperBAITY, REGINA, NP   ASSESSMENT:  1. Dynamic left ventricular outflow obstruction/[place], asymptomatic 2. Hypertension, controlled 3. Chronic diastolic heart failure secondary to problem #1   PLAN:  1. No change in current medical regimen 2. Okay to have symptom limited aerobic exercise 3. Cautioned to call if syncope, dyspnea, or angina.   SUBJECTIVE: Heather Abbott is a 58 y.o. female who is doing relatively well and has no side effects to the current medical regimen. She cannot do without diuretic therapy because of the development of fluid retention and dyspnea.   Wt Readings from Last 3 Encounters:  02/26/14 209 lb (94.802 kg)  09/19/13 203 lb (92.08 kg)  08/15/13 206 lb 6.4 oz (93.622 kg)     Past Medical History  Diagnosis Date  . Hypertension   . Hyperlipidemia     Current Outpatient Prescriptions  Medication Sig Dispense Refill  . Albuterol Sulfate (PROAIR HFA IN) Inhale into the lungs.      Marland Kitchen. losartan-hydrochlorothiazide (HYZAAR) 100-25 MG per tablet TAKE 1 TABLET DAILY  90 tablet  0  . verapamil (CALAN-SR) 180 MG CR tablet Take 1 tablet (180 mg total) by mouth at bedtime.  90 tablet  3   No current facility-administered medications for this visit.    Allergies:   No Known Allergies  Social History:  The patient  reports that she has never smoked. She has never used smokeless tobacco. She reports that she does not drink alcohol or use illicit drugs.   ROS:  Please see the history of present illness.   She's had no palpitations. Prior to starting verapamil she was having some significant intermittent arrhythmia. She has never had sustained tachycardia.   All other systems reviewed and negative.    OBJECTIVE: VS:  BP 128/86  Pulse 79  Ht 5' 10.5" (1.791 m)  Wt 209 lb (94.802 kg)  BMI 29.55 kg/m2 Well nourished, well developed, in no acute distress, moderately obese HEENT: normal Neck: JVD flat. Carotid bruit absent  Cardiac:  normal S1, S2; RRR; 2-3 of 6 crescendo decrescendo systolic murmur Lungs:  clear to auscultation bilaterally, no wheezing, rhonchi or rales Abd: soft, nontender, no hepatomegaly Ext: Edema absent. Pulses 2+ Skin: warm and dry Neuro:  CNs 2-12 intact, no focal abnormalities noted  EKG:  Not performed       Signed, Darci NeedleHenry W. B. Djuana Littleton III, MD 02/26/2014 4:19 PM

## 2014-02-26 NOTE — Patient Instructions (Signed)
Your physician recommends that you continue on your current medications as directed. Please refer to the Current Medication list given to you today.  Call the office if you are experiencing chest discomfort, shortness of breath, fainting/near fainting  Your physician wants you to follow-up in: 9 months with Dr.Smith You will receive a reminder letter in the mail two months in advance. If you don't receive a letter, please call our office to schedule the follow-up appointment.

## 2014-03-26 ENCOUNTER — Encounter: Payer: Self-pay | Admitting: Interventional Cardiology

## 2014-04-11 ENCOUNTER — Other Ambulatory Visit: Payer: Self-pay | Admitting: Interventional Cardiology

## 2014-08-13 ENCOUNTER — Telehealth: Payer: Self-pay

## 2014-08-13 NOTE — Telephone Encounter (Signed)
Left message for pt to call back if she still wants flu vaccine 

## 2014-11-06 ENCOUNTER — Other Ambulatory Visit: Payer: Self-pay | Admitting: Interventional Cardiology

## 2015-02-08 ENCOUNTER — Encounter: Payer: Self-pay | Admitting: Interventional Cardiology

## 2015-02-08 ENCOUNTER — Ambulatory Visit (INDEPENDENT_AMBULATORY_CARE_PROVIDER_SITE_OTHER): Payer: BLUE CROSS/BLUE SHIELD | Admitting: Interventional Cardiology

## 2015-02-08 VITALS — BP 142/88 | HR 69 | Ht 69.0 in | Wt 212.1 lb

## 2015-02-08 DIAGNOSIS — I421 Obstructive hypertrophic cardiomyopathy: Secondary | ICD-10-CM | POA: Diagnosis not present

## 2015-02-08 DIAGNOSIS — E785 Hyperlipidemia, unspecified: Secondary | ICD-10-CM

## 2015-02-08 DIAGNOSIS — I5032 Chronic diastolic (congestive) heart failure: Secondary | ICD-10-CM | POA: Diagnosis not present

## 2015-02-08 DIAGNOSIS — I10 Essential (primary) hypertension: Secondary | ICD-10-CM

## 2015-02-08 LAB — BASIC METABOLIC PANEL
BUN: 18 mg/dL (ref 6–23)
CO2: 27 mEq/L (ref 19–32)
Calcium: 9.4 mg/dL (ref 8.4–10.5)
Chloride: 102 mEq/L (ref 96–112)
Creatinine, Ser: 0.84 mg/dL (ref 0.40–1.20)
GFR: 89.01 mL/min (ref >60.00)
Glucose, Bld: 90 mg/dL (ref 70–99)
Potassium: 3.5 mEq/L (ref 3.5–5.1)
Sodium: 136 mEq/L (ref 135–145)

## 2015-02-08 NOTE — Patient Instructions (Addendum)
Medication Instructions:  Your physician recommends that you continue on your current medications as directed. Please refer to the Current Medication list given to you today.   Labwork: Bmet today  Testing/Procedures: Your physician has recommended that you wear a holter monitor. Holter monitors are medical devices that record the heart's electrical activity. Doctors most often use these monitors to diagnose arrhythmias. Arrhythmias are problems with the speed or rhythm of the heartbeat. The monitor is a small, portable device. You can wear one while you do your normal daily activities. This is usually used to diagnose what is causing palpitations/syncope (passing out).  Your physician has requested that you have an echocardiogram. Echocardiography is a painless test that uses sound waves to create images of your heart. It provides your doctor with information about the size and shape of your heart and how well your heart's chambers and valves are working. This procedure takes approximately one hour. There are no restrictions for this procedure. (To be scheduled in September 2016)  Follow-Up: Your physician wants you to follow-up in: 1 year with Dr.Smith You will receive a reminder letter in the mail two months in advance. If you don't receive a letter, please call our office to schedule the follow-up appointment.   Any Other Special Instructions Will Be Listed Below (If Applicable).

## 2015-02-08 NOTE — Progress Notes (Signed)
Cardiology Office Note   Date:  02/08/2015   ID:  Heather Abbott, DOB 15-Apr-1956, MRN [id number]  PCP:  No PCP Per Patient  Cardiologist:  Lesleigh Noe, MD   Chief Complaint  Patient presents with  . Cardiomyopathy      History of Present Illness: Heather Abbott is a 59 y.o. female who presents for hypertrophic obstructive cardiomyopathy, essential hypertension, and hyperlipidemia. Most recent echocardiogram conclusions in 2014:Study Conclusions  - Left ventricle: The cavity size was normal with near obliteration with contraction. There was mild to moderate concentric hypertrophy. Systolic function was vigorous. The estimated ejection fraction was in the range of 65% to 70%. There was dynamic obstruction in the outflow tract, with a peak velocity of 523cm/sec and a peak gradient of Hg. Wall motion was asynchronous consistent with bundle branch block. Doppler parameters are consistent with abnormal left ventricular relaxation (grade 1 diastolic dysfunction). Doppler parameters are consistent with high ventricular filling pressure. - Mitral valve: Moderately calcified annulus.  The patient is doing well. She denies syncope, angina, dyspnea, and lower extremity swelling. There are no medication side effects. She has multiple questions concerning her heart problem and in particular the genesis of her murmur and concern about "heart failure". She is having no medication side effects. We tried use beta blocker therapy, it causes her to feel weak and she discontinued the medications. She has occasional chest discomfort related to spicy food ingestion  Past Medical History  Diagnosis Date  . Hypertension   . Hyperlipidemia     No past surgical history on file.   Current Outpatient Prescriptions  Medication Sig Dispense Refill  . losartan-hydrochlorothiazide (HYZAAR) 100-12.5 MG per tablet Take 1 tablet by mouth daily.    . verapamil (CALAN-SR)  180 MG CR tablet TAKE 1 TABLET AT BEDTIME 90 tablet 0   No current facility-administered medications for this visit.    Allergies:   Review of patient's allergies indicates no known allergies.    Social History:  The patient  reports that she has never smoked. She has never used smokeless tobacco. She reports that she does not drink alcohol or use illicit drugs.   Family History:  The patient's family history includes Heart failure in her father and mother; Hypertension in her maternal grandfather, maternal grandmother, and mother.    ROS:  Please see the history of present illness.   Otherwise, review of systems are positive for indigestion, otherwise no complaints..   All other systems are reviewed and negative.    PHYSICAL EXAM: VS:  BP 142/88 mmHg  Pulse 69  Ht  (1.753 m)  Wt 96.217 kg (212 lb 1.9 oz)  BMI 31.31 kg/m2 , BMI Body mass index is 31.31 kg/(m^2). GEN: Well nourished, well developed, in no acute distress HEENT: normal Neck: no JVD, carotid bruits, or masses Cardiac: RRR.  There is a 3 to 4/6 systolic murmur that increases in intensity from sitting to standing. An S4 gallop is audible. There is no edema. Peripheral pulses are 2+ and symmetric in the posterior tibial and radials. Respiratory:  clear to auscultation bilaterally, normal work of breathing. GI: soft, nontender, nondistended, + BS MS: no deformity or atrophy Skin: warm and dry, no rash Neuro:  Strength and sensation are intact Psych: euthymic mood, full affect   EKG:  EKG is ordered today. The ekg reveals normal sinus rhythm relatively short PR interval, left atrial abnormality, left ventricular hypertrophy, with interventricular conduction delay.   Recent Labs:  No results found for requested labs within last 365 days.    Lipid Panel    Component Value Date/Time   CHOL 183 02/20/2013 1106   TRIG 71.0 02/20/2013 1106   HDL 46.20 02/20/2013 1106   CHOLHDL 4 02/20/2013 1106   VLDL 14.2  02/20/2013 1106   LDLCALC 123* 02/20/2013 1106      Wt Readings from Last 3 Encounters:  02/08/15 96.217 kg (212 lb 1.9 oz)  02/26/14 94.802 kg (209 lb)  09/19/13 92.08 kg (203 lb)      Other studies Reviewed: Additional studies/ records that were reviewed today include: Review of prior echo. We have never done a Holter monitor on the patient to exclude ventricular arrhythmias.. The findings include a 2014 echo demonstrated an outflow gradient of 104 mmHg. Maximal wall thickness is 14.5 mm..    ASSESSMENT AND PLAN:  1. Hypertrophic obstructive cardiomyopathy Asymptomatic. Classical murmur that intensifies with standing. She has occasional brief palpitations.  2. Essential hypertension, benign Controlled  3. Hyperlipidemia Followed by primary  4. Chronic diastolic heart failure No evidence of congestion or volume overload.  5. Preoperative cardiovascular exam  The patient is cleared for the upcoming oral surgery procedure. Avoid anesthesia the causes peripheral vasodilatation.  Current medicines are reviewed at length with the patient today.  The patient has the following concerns regarding medicines: None.  The following changes/actions have been instituted:    Basic metabolic panel  Echocardiogram in one year  48 hour Holter  Labs/ tests ordered today include:   Orders Placed This Encounter  Procedures  . Basic metabolic panel  . Holter monitor - 48 hour  . EKG 12-Lead  . Echocardiogram     Disposition:   FU with HS in 1 year  Signed, Lesleigh Noe, MD  02/08/2015 9:58 AM    [facility] [address] [place], [place], [place]  [address] Phone: [phone]; Fax: [phone]

## 2015-02-14 ENCOUNTER — Telehealth: Payer: Self-pay

## 2015-02-14 NOTE — Telephone Encounter (Signed)
Pt aware of lab results with verbal understanding. 

## 2015-02-14 NOTE — Telephone Encounter (Signed)
Pt rtn call to Misty Stanley re lab results-pls call  [phone]

## 2015-02-14 NOTE — Telephone Encounter (Signed)
-----   Message from Lyn Records, MD sent at 02/08/2015  6:09 PM EDT ----- Regarding: Heather Abbott,Heather Abbott  Let her know the blood work was normal.  HS

## 2015-02-14 NOTE — Telephone Encounter (Signed)
Called to give pt lab results.lmtcb  

## 2015-02-21 ENCOUNTER — Other Ambulatory Visit: Payer: Self-pay | Admitting: Interventional Cardiology

## 2015-02-21 DIAGNOSIS — R002 Palpitations: Secondary | ICD-10-CM

## 2015-02-21 DIAGNOSIS — I421 Obstructive hypertrophic cardiomyopathy: Secondary | ICD-10-CM

## 2015-02-25 ENCOUNTER — Ambulatory Visit (INDEPENDENT_AMBULATORY_CARE_PROVIDER_SITE_OTHER): Payer: BLUE CROSS/BLUE SHIELD

## 2015-02-25 DIAGNOSIS — I421 Obstructive hypertrophic cardiomyopathy: Secondary | ICD-10-CM

## 2015-02-25 DIAGNOSIS — R002 Palpitations: Secondary | ICD-10-CM | POA: Diagnosis not present

## 2015-03-15 ENCOUNTER — Other Ambulatory Visit: Payer: Self-pay | Admitting: Interventional Cardiology

## 2015-03-21 ENCOUNTER — Telehealth: Payer: Self-pay | Admitting: Interventional Cardiology

## 2015-03-21 NOTE — Telephone Encounter (Signed)
Called LM to return call

## 2015-03-21 NOTE — Telephone Encounter (Signed)
New message      Returning a call to a nurse from days ago to get monitor results

## 2015-03-21 NOTE — Telephone Encounter (Signed)
Left message on pt's confidential voicemail per pt's request. Advised pt to call back with any questions.

## 2015-03-21 NOTE — Telephone Encounter (Signed)
Left message to call back  

## 2015-03-21 NOTE — Telephone Encounter (Signed)
F/u     Pt returning Jennifer's call. Pt states she is at work and can't answer her phone at her desk and that is why she asked previously for a vm to be left. Pt states she would like a detailed vm left for her. If there is a reason that a detailed message can't be left and she needs to speak to someone, please state that in the message.

## 2015-03-21 NOTE — Telephone Encounter (Signed)
See phone note from 10/27

## 2015-03-21 NOTE — Telephone Encounter (Signed)
Follow up   Pt is returning triage RN call  Call back and leave vm

## 2015-04-11 ENCOUNTER — Other Ambulatory Visit: Payer: Self-pay | Admitting: Interventional Cardiology

## 2015-04-11 ENCOUNTER — Telehealth: Payer: Self-pay | Admitting: *Deleted

## 2015-04-11 NOTE — Telephone Encounter (Signed)
Should this patient be taking hyzaar 100-12.5mg  or 100-25mg ? The pharmacy requested the 100-25mg  so that is what I refilled, but then I realized that she also had the 100-12.5mg  listed. Please advise. Thanks, MI

## 2015-04-11 NOTE — Telephone Encounter (Signed)
It looks like pt has been taking Hyzaar 100-25mg  for some time, I don't see where it was reduced

## 2016-01-24 ENCOUNTER — Other Ambulatory Visit (HOSPITAL_COMMUNITY): Payer: BLUE CROSS/BLUE SHIELD

## 2016-01-30 ENCOUNTER — Other Ambulatory Visit: Payer: Self-pay | Admitting: *Deleted

## 2016-01-30 MED ORDER — LOSARTAN POTASSIUM-HCTZ 100-25 MG PO TABS
1.0000 | ORAL_TABLET | Freq: Every day | ORAL | 0 refills | Status: DC
Start: 2016-01-30 — End: 2016-02-21

## 2016-02-12 ENCOUNTER — Encounter (INDEPENDENT_AMBULATORY_CARE_PROVIDER_SITE_OTHER): Payer: Self-pay

## 2016-02-12 ENCOUNTER — Ambulatory Visit (HOSPITAL_COMMUNITY): Payer: BLUE CROSS/BLUE SHIELD | Attending: Cardiology

## 2016-02-12 ENCOUNTER — Other Ambulatory Visit: Payer: Self-pay

## 2016-02-12 DIAGNOSIS — I517 Cardiomegaly: Secondary | ICD-10-CM | POA: Diagnosis not present

## 2016-02-12 DIAGNOSIS — I421 Obstructive hypertrophic cardiomyopathy: Secondary | ICD-10-CM

## 2016-02-12 DIAGNOSIS — I5030 Unspecified diastolic (congestive) heart failure: Secondary | ICD-10-CM | POA: Diagnosis not present

## 2016-02-12 DIAGNOSIS — I348 Other nonrheumatic mitral valve disorders: Secondary | ICD-10-CM | POA: Insufficient documentation

## 2016-02-21 ENCOUNTER — Ambulatory Visit (INDEPENDENT_AMBULATORY_CARE_PROVIDER_SITE_OTHER): Payer: BLUE CROSS/BLUE SHIELD | Admitting: Interventional Cardiology

## 2016-02-21 ENCOUNTER — Encounter: Payer: Self-pay | Admitting: Interventional Cardiology

## 2016-02-21 VITALS — BP 164/90 | HR 66 | Ht 69.0 in | Wt 220.6 lb

## 2016-02-21 DIAGNOSIS — I421 Obstructive hypertrophic cardiomyopathy: Secondary | ICD-10-CM

## 2016-02-21 DIAGNOSIS — I10 Essential (primary) hypertension: Secondary | ICD-10-CM | POA: Diagnosis not present

## 2016-02-21 DIAGNOSIS — E785 Hyperlipidemia, unspecified: Secondary | ICD-10-CM | POA: Diagnosis not present

## 2016-02-21 DIAGNOSIS — I5032 Chronic diastolic (congestive) heart failure: Secondary | ICD-10-CM | POA: Diagnosis not present

## 2016-02-21 MED ORDER — VERAPAMIL HCL ER 240 MG PO TBCR: 240.0000 mg | EXTENDED_RELEASE_TABLET | Freq: Every day | ORAL | 3 refills | Status: DC

## 2016-02-21 MED ORDER — LOSARTAN POTASSIUM-HCTZ 100-25 MG PO TABS: 1.0000 | ORAL_TABLET | Freq: Every day | ORAL | 11 refills | Status: DC

## 2016-02-21 NOTE — Patient Instructions (Signed)
Medication Instructions:  Your physician has recommended you make the following change in your medication:  INCREASE Calan SR  To 240mg  daily. An Rx has been sent to your mail order pharmacy  Take all other medications as prescribed  Labwork: None ordered  Testing/Procedures: None ordered  Follow-Up: Your physician wants you to follow-up in: 1 year with Dr.Smith You will receive a reminder letter in the mail two months in advance. If you don't receive a letter, please call our office to schedule the follow-up appointment.   Any Other Special Instructions Will Be Listed Below (If Applicable).     If you need a refill on your cardiac medications before your next appointment, please call your pharmacy.

## 2016-02-21 NOTE — Progress Notes (Signed)
Cardiology Office Note    Date:  02/21/2016   ID:  Saryah Loper, DOB 1955-06-20, MRN [id number]  PCP:  Leia Alf, MD  Cardiologist: Lesleigh Noe, MD   Chief Complaint  Patient presents with  . Follow-up    Hypertophic CM    History of Present Illness:  Heather Abbott is a 60 y.o. female who presents for hypertrophic obstructive cardiomyopathy, essential hypertension, and hyperlipidemia.  Hypertrophic cardiomyopathy, care by hypertension. Asymptomatic. Specifically denies syncope, orthopnea, PND, palpitations, and angina. Appetite is been stable. Took her medication today but is under a lot of stress.   Past Medical History:  Diagnosis Date  . Hyperlipidemia   . Hypertension     No past surgical history on file.  Current Medications: Outpatient Medications Prior to Visit  Medication Sig Dispense Refill  . losartan-hydrochlorothiazide (HYZAAR) 100-25 MG tablet Take 1 tablet by mouth daily. 30 tablet 0  . verapamil (CALAN-SR) 180 MG CR tablet TAKE 1 TABLET AT BEDTIME (Patient not taking: Reported on 02/21/2016) 90 tablet 3   No facility-administered medications prior to visit.      Allergies:   Review of patient's allergies indicates no known allergies.   Social History   Social History  . Marital status: Divorced    Spouse name: N/A  . Number of children: N/A  . Years of education: N/A   Social History Main Topics  . Smoking status: Never Smoker  . Smokeless tobacco: Never Used  . Alcohol use No  . Drug use: No  . Sexual activity: Not Currently   Other Topics Concern  . None   Social History Narrative  . None     Family History:  The patient's family history includes Heart failure in her father and mother; Hypertension in her maternal grandfather, maternal grandmother, and mother.   ROS:   Please see the history of present illness.    No complaints  All other systems reviewed and are negative.   PHYSICAL EXAM:   VS:  BP (!)  164/90   Pulse 66   Ht 5\' 9"  (1.753 m)   Wt 220 lb 9.6 oz (100.1 kg)   BMI 32.58 kg/m    GEN: Well nourished, well developed, in no acute distress  HEENT: normal  Neck: no JVD, carotid bruits, or masses Cardiac: RRR; no murmurs, rubs, or gallops,no edema  Respiratory:  clear to auscultation bilaterally, normal work of breathing GI: soft, nontender, nondistended, + BS MS: no deformity or atrophy  Skin: warm and dry, no rash Neuro:  Alert and Oriented x 3, Strength and sensation are intact Psych: euthymic mood, full affect  Wt Readings from Last 3 Encounters:  02/21/16 220 lb 9.6 oz (100.1 kg)  02/08/15 212 lb 1.9 oz (96.2 kg)  02/26/14 209 lb (94.8 kg)      Studies/Labs Reviewed:   EKG:  EKG  Left axis deviation, prominent voltage, intraventricular conduction delay, findings compatible with left ventricular hypertrophy. He has pattern V1 and V2. Left atrial abnormality. No change compared to prior.  Recent Labs: No results found for requested labs within last 8760 hours.   Lipid Panel    Component Value Date/Time   CHOL 183 02/20/2013 1106   TRIG 71.0 02/20/2013 1106   HDL 46.20 02/20/2013 1106   CHOLHDL 4 02/20/2013 1106   VLDL 14.2 02/20/2013 1106   LDLCALC 123 (H) 02/20/2013 1106    Additional studies/ records that were reviewed today include:  ECHOCARDIOGRAM 01/2016:  ------------------------------------------------------------------- Study Conclusions   -  Left ventricle: The cavity size was normal. Severe asymmetric   septal hypertrophy. Peak LV outflow tract gradient 46 mmHg.   Systolic function was vigorous. The estimated ejection fraction   was in the range of 65% to 70%. Wall motion was normal; there   were no regional wall motion abnormalities. Features are   consistent with a pseudonormal left ventricular filling pattern,   with concomitant abnormal relaxation and increased filling   pressure (grade 2 diastolic dysfunction). - Aortic valve: There was  no stenosis. - Mitral valve: Moderately calcified annulus. There appeared to be   mild systolic anterior motion of the anterior mitral leaflet.   Hard to visualize the mitral regurgitation, ? moderate. - Left atrium: The atrium was moderately dilated. - Right ventricle: The cavity size was normal. Systolic function   was normal. - Pulmonary arteries: No complete TR doppler jet so unable to   estimate PA systolic pressure. - Inferior vena cava: The vessel was normal in size. The   respirophasic diameter changes were in the normal range (>= 50%),   consistent with normal central venous pressure.   Impressions:   - Normal LV size with severe asymmetric septal hypertrophy. EF   65-70%, moderate diastolic dysfunction. There was a peak LV   outflow tract gradient of 46 mmHg. Mild SAM, hard to visualize   MR, ?moderate. Normal RV size and systolic function.     ASSESSMENT:    1. Chronic diastolic heart failure (HCC)   2. Essential hypertension, benign   3. Hypertrophic obstructive cardiomyopathy (HCC)   4. Hyperlipidemia      PLAN:  In order of problems listed above:  1. No symptoms. No evidence of volume overload. 2. Elevated systolic pressure. Increase verapamil CD 2 240 mg daily. 3. Unable to tolerate beta blockers. Further titrate verapamil. We tried to remove diuretic therapy but it was associated with diastolic heart failure and volume overload. 4. Managed by primary care.    Medication Adjustments/Labs and Tests Ordered: Current medicines are reviewed at length with the patient today.  Concerns regarding medicines are outlined above.  Medication changes, Labs and Tests ordered today are listed in the Patient Instructions below. There are no Patient Instructions on file for this visit.   Signed, Lesleigh NoeHenry W Smith III, MD  02/21/2016 12:07 PM    [facility] [address], [place], [place]  [address] Phone: [phone]; Fax: [phone]

## 2016-03-18 DIAGNOSIS — Z23 Encounter for immunization: Secondary | ICD-10-CM | POA: Diagnosis not present

## 2016-03-18 DIAGNOSIS — Z0000 Encounter for general adult medical examination without abnormal findings: Secondary | ICD-10-CM | POA: Diagnosis not present

## 2016-03-18 DIAGNOSIS — Z6832 Body mass index (BMI) 32.0-32.9, adult: Secondary | ICD-10-CM | POA: Diagnosis not present

## 2016-03-31 ENCOUNTER — Other Ambulatory Visit: Payer: Self-pay | Admitting: Internal Medicine

## 2016-03-31 DIAGNOSIS — Z1231 Encounter for screening mammogram for malignant neoplasm of breast: Secondary | ICD-10-CM

## 2016-04-01 ENCOUNTER — Other Ambulatory Visit: Payer: Self-pay | Admitting: Internal Medicine

## 2016-04-01 ENCOUNTER — Other Ambulatory Visit (HOSPITAL_COMMUNITY)
Admission: RE | Admit: 2016-04-01 | Discharge: 2016-04-01 | Disposition: A | Discharge status: Home / Self Care | Payer: BLUE CROSS/BLUE SHIELD | Source: Ambulatory Visit | Attending: Internal Medicine | Admitting: Internal Medicine

## 2016-04-01 DIAGNOSIS — Z1151 Encounter for screening for human papillomavirus (HPV): Secondary | ICD-10-CM | POA: Insufficient documentation

## 2016-04-01 DIAGNOSIS — I422 Other hypertrophic cardiomyopathy: Secondary | ICD-10-CM | POA: Diagnosis not present

## 2016-04-01 DIAGNOSIS — Z01419 Encounter for gynecological examination (general) (routine) without abnormal findings: Secondary | ICD-10-CM | POA: Insufficient documentation

## 2016-04-01 DIAGNOSIS — I10 Essential (primary) hypertension: Secondary | ICD-10-CM | POA: Diagnosis not present

## 2016-04-01 DIAGNOSIS — J45998 Other asthma: Secondary | ICD-10-CM | POA: Diagnosis not present

## 2016-04-06 LAB — CYTOLOGY - PAP
Diagnosis: NEGATIVE
HPV: NOT DETECTED

## 2016-04-08 DIAGNOSIS — Z780 Asymptomatic menopausal state: Secondary | ICD-10-CM | POA: Diagnosis not present

## 2016-05-01 ENCOUNTER — Ambulatory Visit
Admission: RE | Admit: 2016-05-01 | Discharge: 2016-05-01 | Disposition: A | Discharge status: Home / Self Care | Payer: BLUE CROSS/BLUE SHIELD | Source: Ambulatory Visit | Attending: Internal Medicine | Admitting: Internal Medicine

## 2016-05-01 DIAGNOSIS — Z1231 Encounter for screening mammogram for malignant neoplasm of breast: Secondary | ICD-10-CM

## 2017-02-22 ENCOUNTER — Ambulatory Visit: Payer: BLUE CROSS/BLUE SHIELD | Admitting: Interventional Cardiology

## 2017-03-30 ENCOUNTER — Encounter: Payer: Self-pay | Admitting: Interventional Cardiology

## 2017-03-30 ENCOUNTER — Ambulatory Visit (INDEPENDENT_AMBULATORY_CARE_PROVIDER_SITE_OTHER): Payer: BLUE CROSS/BLUE SHIELD | Admitting: Interventional Cardiology

## 2017-03-30 ENCOUNTER — Encounter (INDEPENDENT_AMBULATORY_CARE_PROVIDER_SITE_OTHER): Payer: Self-pay

## 2017-03-30 VITALS — BP 136/80 | HR 69 | Ht 70.0 in | Wt 215.0 lb

## 2017-03-30 DIAGNOSIS — I421 Obstructive hypertrophic cardiomyopathy: Secondary | ICD-10-CM | POA: Diagnosis not present

## 2017-03-30 DIAGNOSIS — E7849 Other hyperlipidemia: Secondary | ICD-10-CM

## 2017-03-30 DIAGNOSIS — I10 Essential (primary) hypertension: Secondary | ICD-10-CM

## 2017-03-30 DIAGNOSIS — I5032 Chronic diastolic (congestive) heart failure: Secondary | ICD-10-CM | POA: Diagnosis not present

## 2017-03-30 NOTE — Progress Notes (Signed)
Cardiology Office Note    Date:  03/30/2017   ID:  Heather Abbott Kunzler, DOB 12/01/1955, MRN [id number]  PCP:  Leia AlfVaradaragan, Rupashree, MD (Inactive)  Cardiologist: Lesleigh NoeHenry W Talonda Artist III, MD   Chief Complaint  Patient presents with  . Congestive Heart Failure  . Cardiac Valve Problem    History of Present Illness:  Heather Abbott Monda is a 61 y.o. female  who presents for hypertrophic obstructive cardiomyopathy, essential hypertension, and hyperlipidemia.   Heather Abbott is doing well.  She has no cardiac complaints.  She has not had palpitations, syncope, chest pain, edema, orthopnea, or PND.  No limitations to physical activity of been noted.   Past Medical History:  Diagnosis Date  . Hyperlipidemia   . Hypertension     History reviewed. No pertinent surgical history.  Current Medications: Outpatient Medications Prior to Visit  Medication Sig Dispense Refill  . losartan-hydrochlorothiazide (HYZAAR) 100-25 MG tablet Take 1 tablet by mouth daily. 30 tablet 11  . verapamil (CALAN-SR) 240 MG CR tablet Take 1 tablet (240 mg total) by mouth at bedtime. 90 tablet 3   No facility-administered medications prior to visit.      Allergies:   Patient has no known allergies.   Social History   Socioeconomic History  . Marital status: Divorced    Spouse name: None  . Number of children: None  . Years of education: None  . Highest education level: None  Social Needs  . Financial resource strain: None  . Food insecurity - worry: None  . Food insecurity - inability: None  . Transportation needs - medical: None  . Transportation needs - non-medical: None  Occupational History  . None  Tobacco Use  . Smoking status: Never Smoker  . Smokeless tobacco: Never Used  Substance and Sexual Activity  . Alcohol use: No  . Drug use: No  . Sexual activity: Not Currently  Other Topics Concern  . None  Social History Narrative  . None     Family History:  The patient's family history includes Heart  failure in her father and mother; Hypertension in her maternal grandfather, maternal grandmother, and mother.   ROS:   Please see the history of present illness.    none  All other systems reviewed and are negative.   PHYSICAL EXAM:   VS:  BP 136/80   Pulse 69   Ht 5\' 10"  (1.778 m)   Wt 215 lb (97.5 kg)   BMI 30.85 kg/m    GEN: Well nourished, well developed, in no acute distress  HEENT: normal  Neck: no JVD, carotid bruits, or masses Cardiac: RRR. 2/6 systolic murmur that increases with standing and an S4 gallop. No rubs, or edema. Respiratory:  clear to auscultation bilaterally, normal work of breathing GI: soft, nontender, nondistended, + BS MS: no deformity or atrophy  Skin: warm and dry, no rash Neuro:  Alert and Oriented x 3, Strength and sensation are intact Psych: euthymic mood, full affect  Wt Readings from Last 3 Encounters:  03/30/17 215 lb (97.5 kg)  02/21/16 220 lb 9.6 oz (100.1 kg)  02/08/15 212 lb 1.9 oz (96.2 kg)      Studies/Labs Reviewed:   EKG:  EKG normal sinus rhythm, voltage criteria for LVH, left atrial abnormality, intraventricular conduction delay due to LVH, small lateral Q waves.  Recent Labs: No results found for requested labs within last 8760 hours.   Lipid Panel    Component Value Date/Time   CHOL 183 02/20/2013 1106  TRIG 71.0 02/20/2013 1106   HDL 46.20 02/20/2013 1106   CHOLHDL 4 02/20/2013 1106   VLDL 14.2 02/20/2013 1106   LDLCALC 123 (H) 02/20/2013 1106    Additional studies/ records that were reviewed today include:  Echocardiography 2017: ------------------------------------------------------------------- Study Conclusions   - Left ventricle: The cavity size was normal. Severe asymmetric   septal hypertrophy. Peak LV outflow tract gradient 46 mmHg.   Systolic function was vigorous. The estimated ejection fraction   was in the range of 65% to 70%. Wall motion was normal; there   were no regional wall motion  abnormalities. Features are   consistent with a pseudonormal left ventricular filling pattern,   with concomitant abnormal relaxation and increased filling   pressure (grade 2 diastolic dysfunction). - Aortic valve: There was no stenosis. - Mitral valve: Moderately calcified annulus. There appeared to be   mild systolic anterior motion of the anterior mitral leaflet.   Hard to visualize the mitral regurgitation, ? moderate. - Left atrium: The atrium was moderately dilated. - Right ventricle: The cavity size was normal. Systolic function   was normal. - Pulmonary arteries: No complete TR doppler jet so unable to   estimate PA systolic pressure. - Inferior vena cava: The vessel was normal in size. The   respirophasic diameter changes were in the normal range (>= 50%),   consistent with normal central venous pressure.   Impressions:   - Normal LV size with severe asymmetric septal hypertrophy. EF   65-70%, moderate diastolic dysfunction. There was a peak LV   outflow tract gradient of 46 mmHg. Mild SAM, hard to visualize   MR, ?moderate. Normal RV size and systolic function.    Septal and free wall thickness measures 17 mm each.  ASSESSMENT:    1. Hypertrophic obstructive cardiomyopathy (HCC)   2. Chronic diastolic heart failure (HCC)   3. Other hyperlipidemia   4. Essential hypertension, benign      PLAN:  In order of problems listed above:  1. Stable hypertrophic cardiomyopathy.  Septal and free wall thickness is 17 mm.  No current symptoms.  Has moderate LV outflow gradient.  We will plan clinical follow-up.  She did not tolerate beta-blocker therapy. 2. No evidence of volume overload 3. Most recent LDL was 137.  This was in 2017.  We may need to consider primary prevention therapy. 4. 130/90 mmHg unless is the goal for blood pressure.  No change in current therapeutic regimen seems indicated.  Clinical follow-up in 1 year.  Call if chest pain, shortness of breath, or  syncope.  Medication Adjustments/Labs and Tests Ordered: Current medicines are reviewed at length with the patient today.  Concerns regarding medicines are outlined above.  Medication changes, Labs and Tests ordered today are listed in the Patient Instructions below. There are no Patient Instructions on file for this visit.   Signed, Lesleigh NoeHenry W Trezure Cronk III, MD  03/30/2017 4:27 PM    [facility] [address], [place], [place]  [address] Phone: [phone]; Fax: [phone]

## 2017-03-30 NOTE — Patient Instructions (Signed)

## 2017-04-06 DIAGNOSIS — Z0000 Encounter for general adult medical examination without abnormal findings: Secondary | ICD-10-CM | POA: Diagnosis not present

## 2017-04-06 DIAGNOSIS — Z23 Encounter for immunization: Secondary | ICD-10-CM | POA: Diagnosis not present

## 2017-04-06 DIAGNOSIS — Z1159 Encounter for screening for other viral diseases: Secondary | ICD-10-CM | POA: Diagnosis not present

## 2017-04-06 DIAGNOSIS — Z13220 Encounter for screening for lipoid disorders: Secondary | ICD-10-CM | POA: Diagnosis not present

## 2017-04-08 ENCOUNTER — Other Ambulatory Visit: Payer: Self-pay | Admitting: Internal Medicine

## 2017-04-08 DIAGNOSIS — Z1231 Encounter for screening mammogram for malignant neoplasm of breast: Secondary | ICD-10-CM

## 2017-05-07 ENCOUNTER — Ambulatory Visit
Admission: RE | Admit: 2017-05-07 | Discharge: 2017-05-07 | Disposition: A | Discharge status: Home / Self Care | Payer: BLUE CROSS/BLUE SHIELD | Source: Ambulatory Visit | Attending: Internal Medicine | Admitting: Internal Medicine

## 2017-05-07 DIAGNOSIS — Z1231 Encounter for screening mammogram for malignant neoplasm of breast: Secondary | ICD-10-CM

## 2017-10-05 DIAGNOSIS — Z6834 Body mass index (BMI) 34.0-34.9, adult: Secondary | ICD-10-CM | POA: Diagnosis not present

## 2017-10-05 DIAGNOSIS — I10 Essential (primary) hypertension: Secondary | ICD-10-CM | POA: Diagnosis not present

## 2017-10-05 DIAGNOSIS — Z1211 Encounter for screening for malignant neoplasm of colon: Secondary | ICD-10-CM | POA: Diagnosis not present

## 2018-03-31 ENCOUNTER — Other Ambulatory Visit: Payer: Self-pay | Admitting: Internal Medicine

## 2018-03-31 DIAGNOSIS — Z1231 Encounter for screening mammogram for malignant neoplasm of breast: Secondary | ICD-10-CM

## 2018-03-31 NOTE — Progress Notes (Signed)
Cardiology Office Note:    Date:  04/01/2018   ID:  Heather Abbott, DOB 03-Mar-1956, MRN [id number]  PCP:  Leia Alf, MD (Inactive)  Cardiologist:  Lesleigh Noe, MD   Referring MD: No ref. provider found   Chief Complaint  Patient presents with  . Congestive Heart Failure  . Cardiac Valve Problem    History of Present Illness:    Heather Abbott is a 62 y.o. female with a hx of hypertrophic obstructive cardiomyopathy, essential hypertension, and hyperlipidemia.  She is doing okay.  She has not had syncope, palpitations, excessive dyspnea, or chest pain.  No limitations in her lifestyle based upon cardiac symptoms.  She is having some difficulty with her left foot.  No transient neurological symptoms involving her arms or legs.  She is compliant with her medical therapy and denies side effects.  Past Medical History:  Diagnosis Date  . Hyperlipidemia   . Hypertension     History reviewed. No pertinent surgical history.  Current Medications: Current Meds  Medication Sig  . olmesartan-hydrochlorothiazide (BENICAR HCT) 40-25 MG tablet Take 1 tablet by mouth daily.  . verapamil (CALAN-SR) 240 MG CR tablet Take 1 tablet (240 mg total) by mouth at bedtime.     Allergies:   Patient has no known allergies.   Social History   Socioeconomic History  . Marital status: Divorced    Spouse name: Not on file  . Number of children: Not on file  . Years of education: Not on file  . Highest education level: Not on file  Occupational History  . Not on file  Social Needs  . Financial resource strain: Not on file  . Food insecurity:    Worry: Not on file    Inability: Not on file  . Transportation needs:    Medical: Not on file    Non-medical: Not on file  Tobacco Use  . Smoking status: Never Smoker  . Smokeless tobacco: Never Used  Substance and Sexual Activity  . Alcohol use: No  . Drug use: No  . Sexual activity: Not Currently  Lifestyle  . Physical  activity:    Days per week: Not on file    Minutes per session: Not on file  . Stress: Not on file  Relationships  . Social connections:    Talks on phone: Not on file    Gets together: Not on file    Attends religious service: Not on file    Active member of club or organization: Not on file    Attends meetings of clubs or organizations: Not on file    Relationship status: Not on file  Other Topics Concern  . Not on file  Social History Narrative  . Not on file     Family History: The patient's family history includes Heart failure in her father and mother; Hypertension in her maternal grandfather, maternal grandmother, and mother.  ROS:   Please see the history of present illness.    Left foot discomfort.  She will see her primary physician about this next week.  All other systems reviewed and are negative.  EKGs/Labs/Other Studies Reviewed:    The following studies were reviewed today: No new imaging or functional data.  EKG:  EKG is  ordered today.  The ekg ordered today demonstrates normal sinus rhythm, interventricular conduction delay, left axis deviation, ventricular hypertrophy.  Compared to prior tracing from 2018, there is no change in pattern.  Recent Labs: No results found for requested  labs within last 8760 hours.  Recent Lipid Panel    Component Value Date/Time   CHOL 183 02/20/2013 1106   TRIG 71.0 02/20/2013 1106   HDL 46.20 02/20/2013 1106   CHOLHDL 4 02/20/2013 1106   VLDL 14.2 02/20/2013 1106   LDLCALC 123 (H) 02/20/2013 1106    Physical Exam:    VS:  BP (!) 150/88   Pulse 63   Ht 5\' 10"  (1.778 m)   Wt 218 lb 6.4 oz (99.1 kg)   SpO2 96%   BMI 31.34 kg/m     Wt Readings from Last 3 Encounters:  04/01/18 218 lb 6.4 oz (99.1 kg)  03/30/17 215 lb (97.5 kg)  02/21/16 220 lb 9.6 oz (100.1 kg)     GEN:  Well nourished, well developed in no acute distress HEENT: Normal NECK: No JVD. LYMPHATICS: No lymphadenopathy CARDIAC: RRR, 2/6 crescendo  decrescendo right upper sternal and left mid sternal border systolic murmur that increases in intensity with standing.,  Positive S4 gallop, no edema. VASCULAR: 2+ bilateral radial and carotid pulses.  No bruits. RESPIRATORY:  Clear to auscultation without rales, wheezing or rhonchi  ABDOMEN: Soft, non-tender, non-distended, No pulsatile mass, MUSCULOSKELETAL: No deformity  SKIN: Warm and dry NEUROLOGIC:  Alert and oriented x 3 PSYCHIATRIC:  Normal affect   ASSESSMENT:    1. Hypertrophic obstructive cardiomyopathy (HCC)   2. Chronic diastolic heart failure (HCC)   3. Mixed hyperlipidemia   4. Essential hypertension, benign    PLAN:    In order of problems listed above:  1. Relatively asymptomatic.  We again discussed the natural history and genesis of her underlying cardiac condition/murmur respectively.  We discussed the importance of negative inotropic therapy.  We attempted to avoid diuretic therapy but she developed diastolic heart failure with volume overload.  She is currently very stable clinically.  Laboratory data is pending with her primary physician in 1 week. 2. No clinical evidence of volume overload. 3. LDL target should be less than 100 and preferably less than 70 since she has an underlying vascular problem.  The most recent laboratory data is more than a year old. 4. Low-salt diet.  Weight loss discussed.  Moderate aerobic activity is discussed in detail.  Needs to be careful and hydrate during hot weather to avoid dehydration.  Overall education and awareness concerning primary/secondary risk prevention was discussed in detail: LDL less than 70, hemoglobin A1c less than 7, blood pressure target less than 130/80 mmHg, >150 minutes of moderate aerobic activity per week, avoidance of smoking, weight control (via diet and exercise), and continued surveillance/management of/for obstructive sleep apnea.    Medication Adjustments/Labs and Tests Ordered: Current medicines  are reviewed at length with the patient today.  Concerns regarding medicines are outlined above.  Orders Placed This Encounter  Procedures  . EKG 12-Lead   No orders of the defined types were placed in this encounter.   There are no Patient Instructions on file for this visit.   Signed, Lesleigh Noe, MD  04/01/2018 4:04 PM    [facility]

## 2018-04-01 ENCOUNTER — Encounter: Payer: Self-pay | Admitting: Interventional Cardiology

## 2018-04-01 ENCOUNTER — Ambulatory Visit (INDEPENDENT_AMBULATORY_CARE_PROVIDER_SITE_OTHER): Payer: BLUE CROSS/BLUE SHIELD | Admitting: Interventional Cardiology

## 2018-04-01 ENCOUNTER — Ambulatory Visit: Payer: BLUE CROSS/BLUE SHIELD | Admitting: Interventional Cardiology

## 2018-04-01 VITALS — BP 150/88 | HR 63 | Ht 70.0 in | Wt 218.4 lb

## 2018-04-01 DIAGNOSIS — I5032 Chronic diastolic (congestive) heart failure: Secondary | ICD-10-CM

## 2018-04-01 DIAGNOSIS — I10 Essential (primary) hypertension: Secondary | ICD-10-CM | POA: Diagnosis not present

## 2018-04-01 DIAGNOSIS — I421 Obstructive hypertrophic cardiomyopathy: Secondary | ICD-10-CM | POA: Diagnosis not present

## 2018-04-01 DIAGNOSIS — E782 Mixed hyperlipidemia: Secondary | ICD-10-CM | POA: Diagnosis not present

## 2018-04-01 NOTE — Patient Instructions (Signed)

## 2018-04-02 ENCOUNTER — Emergency Department (HOSPITAL_BASED_OUTPATIENT_CLINIC_OR_DEPARTMENT_OTHER)
Admission: EM | Admit: 2018-04-02 | Discharge: 2018-04-03 | Disposition: A | Discharge status: Home / Self Care | Payer: BLUE CROSS/BLUE SHIELD | Attending: Emergency Medicine | Admitting: Emergency Medicine

## 2018-04-02 ENCOUNTER — Emergency Department (HOSPITAL_BASED_OUTPATIENT_CLINIC_OR_DEPARTMENT_OTHER): Payer: BLUE CROSS/BLUE SHIELD

## 2018-04-02 ENCOUNTER — Other Ambulatory Visit: Payer: Self-pay

## 2018-04-02 DIAGNOSIS — W2209XA Striking against other stationary object, initial encounter: Secondary | ICD-10-CM | POA: Diagnosis not present

## 2018-04-02 DIAGNOSIS — S93125A Dislocation of metatarsophalangeal joint of left lesser toe(s), initial encounter: Secondary | ICD-10-CM | POA: Diagnosis not present

## 2018-04-02 DIAGNOSIS — Y92000 Kitchen of unspecified non-institutional (private) residence as  the place of occurrence of the external cause: Secondary | ICD-10-CM | POA: Diagnosis not present

## 2018-04-02 DIAGNOSIS — Y998 Other external cause status: Secondary | ICD-10-CM | POA: Diagnosis not present

## 2018-04-02 DIAGNOSIS — S93105A Unspecified dislocation of left toe(s), initial encounter: Secondary | ICD-10-CM

## 2018-04-02 DIAGNOSIS — S99922A Unspecified injury of left foot, initial encounter: Secondary | ICD-10-CM | POA: Diagnosis not present

## 2018-04-02 DIAGNOSIS — S93115A Dislocation of interphalangeal joint of left lesser toe(s), initial encounter: Secondary | ICD-10-CM | POA: Insufficient documentation

## 2018-04-02 DIAGNOSIS — Y9301 Activity, walking, marching and hiking: Secondary | ICD-10-CM | POA: Insufficient documentation

## 2018-04-02 NOTE — ED Triage Notes (Signed)
Pt states that she hit her second toe on the left foot against her kitchen counter. No open injury but there is a a deformity

## 2018-04-02 NOTE — ED Triage Notes (Signed)
Pt took 2 extra strength tylenol at home at approx 2245

## 2018-04-03 ENCOUNTER — Emergency Department (HOSPITAL_BASED_OUTPATIENT_CLINIC_OR_DEPARTMENT_OTHER): Payer: BLUE CROSS/BLUE SHIELD

## 2018-04-03 NOTE — ED Provider Notes (Signed)
[facility] EMERGENCY DEPARTMENT Provider Note   CSN: [id number] Arrival date & time: 04/02/18  2312     History   Chief Complaint Chief Complaint  Patient presents with  . Toe Injury    HPI Heather Abbott is a 62 y.o. female.  HPI   Presents with left second toe pain and deformity. Reports she believes she hit it on the refrigerator or something else in her kitchen.  Reports didn't have significant pain at first but then developed throbbing pain. Took tylenol just prior to arrival. No numbness.   Past Medical History:  Diagnosis Date  . Hyperlipidemia   . Hypertension     Patient Active Problem List   Diagnosis Date Noted  . Chronic diastolic heart failure (HCC) 08/15/2013  . Hypertrophic obstructive cardiomyopathy (HCC) 07/21/2013  . Hyperlipidemia 04/28/2013  . Essential hypertension, benign 02/20/2013    No past surgical history on file.   OB History    Gravida  2   Para  2   Term  2   Preterm      AB      Living  2     SAB      TAB      Ectopic      Multiple      Live Births               Home Medications    Prior to Admission medications   Medication Sig Start Date End Date Taking? Authorizing Provider  olmesartan-hydrochlorothiazide (BENICAR HCT) 40-25 MG tablet Take 1 tablet by mouth daily. 03/15/18   [provider]  verapamil (CALAN-SR) 240 MG CR tablet Take 1 tablet (240 mg total) by mouth at bedtime. 02/21/16   Lyn Records, MD    Family History Family History  Problem Relation Age of Onset  . Hypertension Mother   . Heart failure Mother   . Hypertension Maternal Grandmother   . Hypertension Maternal Grandfather   . Heart failure Father     Social History Social History   Tobacco Use  . Smoking status: Never Smoker  . Smokeless tobacco: Never Used  Substance Use Topics  . Alcohol use: No  . Drug use: No     Allergies   Patient has no known allergies.   Review of Systems Review of  Systems  Constitutional: Negative for fever.  HENT: Negative for sore throat.   Genitourinary: Negative for difficulty urinating.  Musculoskeletal: Positive for arthralgias.  Skin: Negative for rash.  Neurological: Negative for syncope and headaches.     Physical Exam Updated Vital Signs BP (!) 162/117 (BP Location: Left Arm)   Pulse 73   Temp 97.7 F (36.5 C)   Resp 18   Ht 5\' 10"  (1.778 m)   Wt 99.1 kg   SpO2 97%   BMI 31.34 kg/m   Physical Exam  Constitutional: She is oriented to person, place, and time. She appears well-developed and well-nourished. No distress.  HENT:  Head: Normocephalic and atraumatic.  Eyes: Conjunctivae and EOM are normal.  Neck: Normal range of motion.  Cardiovascular: Normal rate, regular rhythm and intact distal pulses.  Pulmonary/Chest: Effort normal. No respiratory distress.  Musculoskeletal: She exhibits no edema.       Left foot: There is tenderness, bony tenderness and deformity. There is no swelling, normal capillary refill and no laceration.  Deformity left second toe  Neurological: She is alert and oriented to person, place, and time.  Skin: Skin is warm  and dry. No rash noted. She is not diaphoretic. No erythema.  Nursing note and vitals reviewed.    ED Treatments / Results  Labs (all labs ordered are listed, but only abnormal results are displayed) Labs Reviewed - No data to display  EKG None  Radiology Dg Foot 2 Views Left  Result Date: 04/03/2018 CLINICAL DATA:  Reduction of second toe dislocation EXAM: LEFT FOOT - 2 VIEW COMPARISON:  Earlier films, same date FINDINGS: Interval reduction of the PIP dislocation of the second toe. There is slight persistent lateral subluxation of the middle phalanx. No definite fracture. IMPRESSION: Interval reduction of the PIP dislocation of the second toe. Mild persistent lateral subluxation. Electronically Signed   By: Rudie Meyer M.D.   On: 04/03/2018 01:13   Dg Foot Complete  Left  Result Date: 04/03/2018 CLINICAL DATA:  Struck toe on cabinet tonight. EXAM: LEFT FOOT - COMPLETE 3+ VIEW COMPARISON:  None. FINDINGS: There is a dislocation noted at the PIP joint of the second toe. The middle phalanx is dislocated laterally and dorsally. No obvious fracture. The other joint spaces are maintained. No fractures are identified. Mild pes cavus. Moderate calcaneal heel spur. IMPRESSION: Dislocation at the PIP joint of the second toe. Electronically Signed   By: Rudie Meyer M.D.   On: 04/03/2018 00:05    Procedures .Ortho Injury Treatment Date/Time: 04/03/2018 12:17 PM Performed by: Alvira Monday, MD Authorized by: Alvira Monday, MD  Injury location: toe Location details: left second toe Injury type: dislocation Dislocation type: PIP Pre-procedure neurovascular assessment: neurovascularly intact Pre-procedure distal perfusion: normal Pre-procedure neurological function: normal Pre-procedure range of motion: reduced Manipulation performed: yes Reduction successful: yes X-ray confirmed reduction: yes Immobilization: tape Post-procedure distal perfusion: normal Post-procedure neurological function: normal Post-procedure range of motion: normal Patient tolerance: Patient tolerated the procedure well with no immediate complications    (including critical care time)  Medications Ordered in ED Medications - No data to display   Initial Impression / Assessment and Plan / ED Course  I have reviewed the triage vital signs and the nursing notes.  Pertinent labs & imaging results that were available during my care of the patient were reviewed by me and considered in my medical decision making (see chart for details).     62yo female presents with concern for deformity and pain of the left second toe.  Pt NV intact. PIP dislocation on XR. Discussed options for reduction and pt agreed to reduction without digital block. XR shows interval reduction, slight  lateral subluxation, suspect ligamentous laxity contributing. Buddy tape placed, recommend podiatry follow up. Wearing flat open toed shoe. Patient discharged in stable condition with understanding of reasons to return.   Final Clinical Impressions(s) / ED Diagnoses   Final diagnoses:  Dislocation of phalanx of left foot, initial encounter    ED Discharge Orders    None       Alvira Monday, MD 04/03/18 1241

## 2018-04-03 NOTE — ED Notes (Signed)
ED Provider at bedside. 

## 2018-04-11 DIAGNOSIS — Z6832 Body mass index (BMI) 32.0-32.9, adult: Secondary | ICD-10-CM | POA: Diagnosis not present

## 2018-04-11 DIAGNOSIS — Z23 Encounter for immunization: Secondary | ICD-10-CM | POA: Diagnosis not present

## 2018-04-11 DIAGNOSIS — I422 Other hypertrophic cardiomyopathy: Secondary | ICD-10-CM | POA: Diagnosis not present

## 2018-04-11 DIAGNOSIS — Z0000 Encounter for general adult medical examination without abnormal findings: Secondary | ICD-10-CM | POA: Diagnosis not present

## 2018-04-11 DIAGNOSIS — I10 Essential (primary) hypertension: Secondary | ICD-10-CM | POA: Diagnosis not present

## 2018-05-12 ENCOUNTER — Ambulatory Visit
Admission: RE | Admit: 2018-05-12 | Discharge: 2018-05-12 | Disposition: A | Discharge status: Home / Self Care | Payer: BLUE CROSS/BLUE SHIELD | Source: Ambulatory Visit | Attending: Internal Medicine | Admitting: Internal Medicine

## 2018-05-12 DIAGNOSIS — Z1231 Encounter for screening mammogram for malignant neoplasm of breast: Secondary | ICD-10-CM

## 2018-05-13 ENCOUNTER — Ambulatory Visit: Payer: BLUE CROSS/BLUE SHIELD

## 2019-03-20 ENCOUNTER — Other Ambulatory Visit: Payer: Self-pay | Admitting: Internal Medicine

## 2019-03-20 DIAGNOSIS — Z1231 Encounter for screening mammogram for malignant neoplasm of breast: Secondary | ICD-10-CM

## 2019-05-15 ENCOUNTER — Other Ambulatory Visit: Payer: Self-pay

## 2019-05-15 ENCOUNTER — Ambulatory Visit
Admission: RE | Admit: 2019-05-15 | Discharge: 2019-05-15 | Disposition: A | Discharge status: Home / Self Care | Payer: BC Managed Care – PPO | Source: Ambulatory Visit | Attending: Internal Medicine | Admitting: Internal Medicine

## 2019-05-15 DIAGNOSIS — Z1231 Encounter for screening mammogram for malignant neoplasm of breast: Secondary | ICD-10-CM | POA: Diagnosis not present

## 2019-06-06 NOTE — Progress Notes (Signed)
Cardiology Office Note:    Date:  06/07/2019   ID:  Heather Abbott, DOB 03/02/1956, MRN [id number]  PCP:  Leia Alf, MD (Inactive)  Cardiologist:  Lesleigh Noe, MD   Referring MD: No ref. provider found   Chief Complaint  Patient presents with  . Congestive Heart Failure  . Advice Only    Hypertrophic obstructive cardiomyopathy    History of Present Illness:    Heather Abbott is a 64 y.o. female with a hx of hypertrophic obstructive cardiomyopathy, essential hypertension, and hyperlipidemia.  Limited activity due to COVID-19 pandemic.  No specific complaints of dyspnea, orthopnea, palpitations, chest pain, syncope, or limitations in physical activity.  No medication side effects.  Past Medical History:  Diagnosis Date  . Hyperlipidemia   . Hypertension     History reviewed. No pertinent surgical history.  Current Medications: Current Meds  Medication Sig  . olmesartan-hydrochlorothiazide (BENICAR HCT) 40-25 MG tablet Take 1 tablet by mouth daily.  . simvastatin (ZOCOR) 20 MG tablet Take 20 mg by mouth at bedtime.  . verapamil (CALAN-SR) 240 MG CR tablet Take 1 tablet (240 mg total) by mouth at bedtime.     Allergies:   Patient has no known allergies.   Social History   Socioeconomic History  . Marital status: Divorced    Spouse name: Not on file  . Number of children: Not on file  . Years of education: Not on file  . Highest education level: Not on file  Occupational History  . Not on file  Tobacco Use  . Smoking status: Never Smoker  . Smokeless tobacco: Never Used  Substance and Sexual Activity  . Alcohol use: No  . Drug use: No  . Sexual activity: Not Currently  Other Topics Concern  . Not on file  Social History Narrative  . Not on file   Social Determinants of Health   Financial Resource Strain:   . Difficulty of Paying Living Expenses: Not on file  Food Insecurity:   . Worried About Programme researcher, broadcasting/film/video in the Last Year: Not on  file  . Ran Out of Food in the Last Year: Not on file  Transportation Needs:   . Lack of Transportation (Medical): Not on file  . Lack of Transportation (Non-Medical): Not on file  Physical Activity:   . Days of Exercise per Week: Not on file  . Minutes of Exercise per Session: Not on file  Stress:   . Feeling of Stress : Not on file  Social Connections:   . Frequency of Communication with Friends and Family: Not on file  . Frequency of Social Gatherings with Friends and Family: Not on file  . Attends Religious Services: Not on file  . Active Member of Clubs or Organizations: Not on file  . Attends Banker Meetings: Not on file  . Marital Status: Not on file     Family History: The patient's family history includes Heart failure in her father and mother; Hypertension in her maternal grandfather, maternal grandmother, and mother.  ROS:   Please see the history of present illness.    Some trace ankle edema has been noted.  Very sedentary over the last several months because of COVID-19.  All other systems reviewed and are negative.  EKGs/Labs/Other Studies Reviewed:    The following studies were reviewed today: 2D Doppler echocardiogram 2017: ------------------------------------------------------------------- Study Conclusions  - Left ventricle: The cavity size was normal. Severe asymmetric   septal hypertrophy. Peak LV  outflow tract gradient 46 mmHg.   Systolic function was vigorous. The estimated ejection fraction   was in the range of 65% to 70%. Wall motion was normal; there   were no regional wall motion abnormalities. Features are   consistent with a pseudonormal left ventricular filling pattern,   with concomitant abnormal relaxation and increased filling   pressure (grade 2 diastolic dysfunction). - Aortic valve: There was no stenosis. - Mitral valve: Moderately calcified annulus. There appeared to be   mild systolic anterior motion of the anterior  mitral leaflet.   Hard to visualize the mitral regurgitation, ? moderate. - Left atrium: The atrium was moderately dilated. - Right ventricle: The cavity size was normal. Systolic function   was normal. - Pulmonary arteries: No complete TR doppler jet so unable to   estimate PA systolic pressure. - Inferior vena cava: The vessel was normal in size. The   respirophasic diameter changes were in the normal range (>= 50%),   consistent with normal central venous pressure.  Impressions:  - Normal LV size with severe asymmetric septal hypertrophy. EF   65-70%, moderate diastolic dysfunction. There was a peak LV   outflow tract gradient of 46 mmHg. Mild SAM, hard to visualize   MR, ?moderate. Normal RV size and systolic function.   EKG:  EKG normal sinus rhythm, left atrial abnormality, left axis deviation, interventricular conduction delay/left bundle branch block.  When compared to April 01, 2018, QRS is slightly wider.  Recent Labs: No results found for requested labs within last 8760 hours.  Recent Lipid Panel    Component Value Date/Time   CHOL 183 02/20/2013 1106   TRIG 71.0 02/20/2013 1106   HDL 46.20 02/20/2013 1106   CHOLHDL 4 02/20/2013 1106   VLDL 14.2 02/20/2013 1106   LDLCALC 123 (H) 02/20/2013 1106    Physical Exam:    VS:  BP (!) 156/88   Pulse 72   Ht 5\' 10"  (1.778 m)   Wt 232 lb 12.8 oz (105.6 kg)   SpO2 98%   BMI 33.40 kg/m     Wt Readings from Last 3 Encounters:  06/07/19 232 lb 12.8 oz (105.6 kg)  04/02/18 218 lb 6.2 oz (99.1 kg)  04/01/18 218 lb 6.4 oz (99.1 kg)     GEN: Moderate obesity. No acute distress HEENT: Normal NECK: No JVD. LYMPHATICS: No lymphadenopathy CARDIAC: 3/6 left parasternal and right upper sternal systolic murmur that increases in intensity with Valsalva.  RRR without diastolic murmur, gallop, or edema. VASCULAR:  Normal Pulses. No bruits. RESPIRATORY:  Clear to auscultation without rales, wheezing or rhonchi  ABDOMEN:  Soft, non-tender, non-distended, No pulsatile mass, MUSCULOSKELETAL: No deformity  SKIN: Warm and dry NEUROLOGIC:  Alert and oriented x 3 PSYCHIATRIC:  Normal affect   ASSESSMENT:    1. Hypertrophic obstructive cardiomyopathy ([place])   2. Dynamic left ventricular outflow obstruction   3. Chronic diastolic heart failure ([place])   4. Mixed hyperlipidemia   5. Essential hypertension, benign   6. Educated about COVID-19 virus infection    PLAN:    In order of problems listed above:  1. Hypertrophic cardiomyopathy.  Last imaging study 2017.  Prior to the next office visit we will do a cardiac MRI to assess wall thickness and look for evidence of progression or high risk features. 2. Clinically demonstrated on exam today. 3. No evidence of volume overload. 4. On medium intensity statin therapy. 5. Mildly elevated systolic pressure.  Repeat pressure 140/82 mmHg. 6. 3W,'s  and COVID-19 vaccine were discussed and are reviewed up unfavorably by the patient   Medication Adjustments/Labs and Tests Ordered: Current medicines are reviewed at length with the patient today.  Concerns regarding medicines are outlined above.  No orders of the defined types were placed in this encounter.  No orders of the defined types were placed in this encounter.   There are no Patient Instructions on file for this visit.   Signed, Lesleigh Noe, MD  06/07/2019 4:31 PM    [facility]

## 2019-06-07 ENCOUNTER — Ambulatory Visit (INDEPENDENT_AMBULATORY_CARE_PROVIDER_SITE_OTHER): Payer: BC Managed Care – PPO | Admitting: Interventional Cardiology

## 2019-06-07 ENCOUNTER — Encounter: Payer: Self-pay | Admitting: Interventional Cardiology

## 2019-06-07 ENCOUNTER — Other Ambulatory Visit: Payer: Self-pay

## 2019-06-07 VITALS — BP 156/88 | HR 72 | Ht 70.0 in | Wt 232.8 lb

## 2019-06-07 DIAGNOSIS — E782 Mixed hyperlipidemia: Secondary | ICD-10-CM

## 2019-06-07 DIAGNOSIS — I5189 Other ill-defined heart diseases: Secondary | ICD-10-CM | POA: Diagnosis not present

## 2019-06-07 DIAGNOSIS — I5032 Chronic diastolic (congestive) heart failure: Secondary | ICD-10-CM | POA: Diagnosis not present

## 2019-06-07 DIAGNOSIS — I10 Essential (primary) hypertension: Secondary | ICD-10-CM

## 2019-06-07 DIAGNOSIS — I421 Obstructive hypertrophic cardiomyopathy: Secondary | ICD-10-CM | POA: Diagnosis not present

## 2019-06-07 DIAGNOSIS — Z7189 Other specified counseling: Secondary | ICD-10-CM

## 2019-06-07 NOTE — Patient Instructions (Signed)
Medication Instructions:  Your physician recommends that you continue on your current medications as directed. Please refer to the Current Medication list given to you today. *If you need a refill on your cardiac medications before your next appointment, please call your pharmacy*  Lab Work: None If you have labs (blood work) drawn today and your tests are completely normal, you will receive your results only by: Marland Kitchen MyChart Message (if you have MyChart) OR . A paper copy in the mail If you have any lab test that is abnormal or we need to change your treatment, we will call you to review the results.  Testing/Procedures:  Your physician has requested that you have a cardiac MRI in December 2021. Cardiac MRI uses a computer to create images of your heart as its beating, producing both still and moving pictures of your heart and major blood vessels. For further information please visit [email or web address]. Please follow the instruction sheet given to you today for more information.    Follow-Up: At [facility], you and your health needs are our priority.  As part of our continuing mission to provide you with exceptional heart care, we have created designated Provider Care Teams.  These Care Teams include your primary Cardiologist (physician) and Advanced Practice Providers (APPs -  Physician Assistants and Nurse Practitioners) who all work together to provide you with the care you need, when you need it.  Your next appointment:   12 month(s)  The format for your next appointment:   In Person  Provider:   You may see Lesleigh Noe, MD or one of the following Advanced Practice Providers on your designated Care Team:    Norma Fredrickson, NP  Nada Boozer, NP  Georgie Chard, NP   Other Instructions

## 2019-06-12 NOTE — Addendum Note (Signed)
Addended by: Cleda Mccreedy on: 06/12/2019 08:24 AM   Modules accepted: Orders

## 2019-09-06 DIAGNOSIS — Z0000 Encounter for general adult medical examination without abnormal findings: Secondary | ICD-10-CM | POA: Diagnosis not present

## 2019-09-08 ENCOUNTER — Ambulatory Visit: Payer: BC Managed Care – PPO | Attending: Internal Medicine

## 2019-09-08 DIAGNOSIS — Z23 Encounter for immunization: Secondary | ICD-10-CM

## 2019-09-08 NOTE — Progress Notes (Signed)
   Covid-19 Vaccination Clinic  Name:  Heather Abbott    MRN: [id number] DOB: May 10, 1956  09/08/2019  [name] was observed post Covid-19 immunization for 15 minutes without incident. She was provided with Vaccine Information Sheet and instruction to access the V-Safe system.   [name] was instructed to call 911 with any severe reactions post vaccine: Marland Kitchen Difficulty breathing  . Swelling of face and throat  . A fast heartbeat  . A bad rash all over body  . Dizziness and weakness   Immunizations Administered    Name Date Dose VIS Date Route   Pfizer COVID-19 Vaccine 09/08/2019 10:08 AM 0.3 mL 05/05/2019 Intramuscular   Manufacturer: [employer], [employer]   Lot: [id number]   NDC: [address]-3

## 2019-10-02 ENCOUNTER — Ambulatory Visit: Payer: BC Managed Care – PPO | Attending: Internal Medicine

## 2019-10-02 DIAGNOSIS — I10 Essential (primary) hypertension: Secondary | ICD-10-CM | POA: Diagnosis not present

## 2019-10-02 DIAGNOSIS — Z1211 Encounter for screening for malignant neoplasm of colon: Secondary | ICD-10-CM | POA: Diagnosis not present

## 2019-10-02 DIAGNOSIS — Z23 Encounter for immunization: Secondary | ICD-10-CM

## 2019-10-02 DIAGNOSIS — E785 Hyperlipidemia, unspecified: Secondary | ICD-10-CM | POA: Diagnosis not present

## 2019-10-02 NOTE — Progress Notes (Signed)
   Covid-19 Vaccination Clinic  Name:  Heather Abbott    MRN: [id number] DOB: 05/30/1955  10/02/2019  [name] was observed post Covid-19 immunization for 15 minutes without incident. She was provided with Vaccine Information Sheet and instruction to access the V-Safe system.   [name] was instructed to call 911 with any severe reactions post vaccine: Marland Kitchen Difficulty breathing  . Swelling of face and throat  . A fast heartbeat  . A bad rash all over body  . Dizziness and weakness   Immunizations Administered    Name Date Dose VIS Date Route   Pfizer COVID-19 Vaccine 10/02/2019  9:14 AM 0.3 mL 07/19/2018 Intramuscular   Manufacturer: [employer], [employer]   Lot: [id number]   NDC: [address]-1

## 2019-10-16 ENCOUNTER — Other Ambulatory Visit (HOSPITAL_COMMUNITY)
Admission: RE | Admit: 2019-10-16 | Discharge: 2019-10-16 | Disposition: A | Discharge status: Home / Self Care | Payer: BC Managed Care – PPO | Source: Ambulatory Visit | Attending: Internal Medicine | Admitting: Internal Medicine

## 2019-10-16 ENCOUNTER — Other Ambulatory Visit: Payer: Self-pay | Admitting: Internal Medicine

## 2019-10-16 DIAGNOSIS — Z124 Encounter for screening for malignant neoplasm of cervix: Secondary | ICD-10-CM | POA: Insufficient documentation

## 2019-10-16 DIAGNOSIS — I10 Essential (primary) hypertension: Secondary | ICD-10-CM | POA: Diagnosis not present

## 2019-10-18 LAB — CYTOLOGY - PAP
Comment: NEGATIVE
Diagnosis: NEGATIVE
High risk HPV: NEGATIVE

## 2020-04-24 ENCOUNTER — Telehealth: Payer: Self-pay | Admitting: Interventional Cardiology

## 2020-04-24 NOTE — Telephone Encounter (Signed)
Left message for patient to call and discuss scheduling the Cardiac MRI ordered by Dr. Smith 

## 2020-04-26 NOTE — Telephone Encounter (Signed)
Left message for patient to call and discuss scheduling the Cardiac MRI ordered by Dr. Katrinka Blazing

## 2020-04-29 NOTE — Telephone Encounter (Signed)
Left message for patient to call and discuss scheduling the Cardiac MRI ordered by Dr. Smith 

## 2020-05-02 NOTE — Telephone Encounter (Signed)
Left message for patient to call and discuss scheduling the Cardiac MRI that was ordered by Dr. Katrinka Blazing

## 2020-05-06 ENCOUNTER — Encounter: Payer: Self-pay | Admitting: Interventional Cardiology

## 2020-05-08 ENCOUNTER — Encounter: Payer: Self-pay | Admitting: Interventional Cardiology

## 2020-05-08 NOTE — Telephone Encounter (Signed)
Left message for patient regarding the Cardiac MRI scheduled Tuesday 05/21/20 at 7:00 am at [facility]---arrival time is 6:30 am 1st floor admissions office.  Will mail information to patient and it is also in My Chart---requested patient call with questions or concerns.

## 2020-05-14 ENCOUNTER — Other Ambulatory Visit: Payer: Self-pay | Admitting: Internal Medicine

## 2020-05-14 DIAGNOSIS — Z1231 Encounter for screening mammogram for malignant neoplasm of breast: Secondary | ICD-10-CM

## 2020-05-16 ENCOUNTER — Ambulatory Visit: Payer: BC Managed Care – PPO | Attending: Critical Care Medicine

## 2020-05-16 DIAGNOSIS — Z23 Encounter for immunization: Secondary | ICD-10-CM

## 2020-05-16 NOTE — Progress Notes (Signed)
   Covid-19 Vaccination Clinic  Name:  Amos Gaber    MRN: [id number] DOB: 01-Oct-1955  05/16/2020  Ms. Martensen was observed post Covid-19 immunization for 15 minutes without incident. She was provided with Vaccine Information Sheet and instruction to access the V-Safe system.   Ms. Hubka was instructed to call 911 with any severe reactions post vaccine: Marland Kitchen Difficulty breathing  . Swelling of face and throat  . A fast heartbeat  . A bad rash all over body  . Dizziness and weakness   Immunizations Administered    Name Date Dose VIS Date Route   Pfizer COVID-19 Vaccine 05/16/2020  1:54 PM 0.3 mL 03/13/2020 Intramuscular   Manufacturer: [employer], [employer]   Lot: [id number]   NDC: [address]-6

## 2020-05-20 ENCOUNTER — Telehealth (HOSPITAL_COMMUNITY): Payer: Self-pay | Admitting: Emergency Medicine

## 2020-05-20 NOTE — Telephone Encounter (Signed)
Calling patient to review cMR instructions for test tomorrow.   Pt states she hasnt had any changes in condition to warrant this test and not sure why she needs this test. Attempted to review some of Dr. Michaelle Copas office notes which say to complete cardiac MR in Dec 2021 for known HOCM after last echo in 2017 that showed asymmetric septal hypertrophy.    Pt states she did not ask enough questions during the last office visit and wishes to postpone this test for the time being.  Rockwell Alexandria RN Navigator [facility] [phone] Office  [phone] Cell

## 2020-05-21 ENCOUNTER — Encounter (HOSPITAL_COMMUNITY): Payer: Self-pay

## 2020-05-21 ENCOUNTER — Ambulatory Visit (HOSPITAL_COMMUNITY): Admission: RE | Admit: 2020-05-21 | Payer: BC Managed Care – PPO | Source: Ambulatory Visit

## 2020-07-01 ENCOUNTER — Ambulatory Visit
Admission: RE | Admit: 2020-07-01 | Discharge: 2020-07-01 | Disposition: A | Discharge status: Home / Self Care | Payer: BC Managed Care – PPO | Source: Ambulatory Visit | Attending: Internal Medicine | Admitting: Internal Medicine

## 2020-07-01 ENCOUNTER — Other Ambulatory Visit: Payer: Self-pay

## 2020-07-01 ENCOUNTER — Ambulatory Visit: Payer: BC Managed Care – PPO

## 2020-07-01 DIAGNOSIS — Z1231 Encounter for screening mammogram for malignant neoplasm of breast: Secondary | ICD-10-CM | POA: Diagnosis not present

## 2020-10-15 DIAGNOSIS — Z0000 Encounter for general adult medical examination without abnormal findings: Secondary | ICD-10-CM | POA: Diagnosis not present

## 2020-10-15 DIAGNOSIS — I10 Essential (primary) hypertension: Secondary | ICD-10-CM | POA: Diagnosis not present

## 2020-10-15 DIAGNOSIS — E785 Hyperlipidemia, unspecified: Secondary | ICD-10-CM | POA: Diagnosis not present

## 2021-04-23 DIAGNOSIS — R040 Epistaxis: Secondary | ICD-10-CM | POA: Diagnosis not present

## 2021-04-23 DIAGNOSIS — I10 Essential (primary) hypertension: Secondary | ICD-10-CM | POA: Diagnosis not present

## 2021-04-23 DIAGNOSIS — J309 Allergic rhinitis, unspecified: Secondary | ICD-10-CM | POA: Diagnosis not present

## 2021-04-23 DIAGNOSIS — Z23 Encounter for immunization: Secondary | ICD-10-CM | POA: Diagnosis not present

## 2021-06-26 ENCOUNTER — Other Ambulatory Visit: Payer: Self-pay | Admitting: Internal Medicine

## 2021-06-27 ENCOUNTER — Other Ambulatory Visit: Payer: Self-pay | Admitting: Internal Medicine

## 2021-06-27 ENCOUNTER — Other Ambulatory Visit: Payer: Self-pay

## 2021-06-27 DIAGNOSIS — Z1231 Encounter for screening mammogram for malignant neoplasm of breast: Secondary | ICD-10-CM

## 2021-07-04 ENCOUNTER — Ambulatory Visit
Admission: RE | Admit: 2021-07-04 | Discharge: 2021-07-04 | Disposition: A | Discharge status: Home / Self Care | Payer: BC Managed Care – PPO | Source: Ambulatory Visit | Attending: Internal Medicine | Admitting: Internal Medicine

## 2021-07-04 ENCOUNTER — Other Ambulatory Visit: Payer: Self-pay

## 2021-07-04 DIAGNOSIS — Z1231 Encounter for screening mammogram for malignant neoplasm of breast: Secondary | ICD-10-CM

## 2021-09-28 NOTE — Progress Notes (Signed)
?Cardiology Office Note:   ? ?Date:  09/29/2021  ? ?ID:  Heather Russianheresa P Halt, DOB 09/29/1955, MRN [id number] ? ?PCP:  Leia AlfVaradaragan, Rupashree, MD (Inactive)  ?Cardiologist:  Lesleigh NoeHenry W Morgon Pamer III, MD  ? ?Referring MD: No ref. provider found  ? ?Chief Complaint  ?Patient presents with  ? Follow-up  ?  Hypertrophic obstructive cardiomyopathy, ?Primary hypertension, ?Hyperlipidemia  ? ? ?History of Present Illness:   ? ?Heather Abbott is a 66 y.o. female with a hx of  hypertrophic obstructive cardiomyopathy, essential hypertension, and hyperlipidemia ? ?I have requested a cardiac MRI to further characterize risk in this patient with hypertrophic obstructive cardiomyopathy.  Again I spoke to her about the potential heritable nature of hypertrophic cardiomyopathy and that her 2 children she had at least half physical examination to listen for murmur and be considered for 2D Doppler echocardiography. ? ?She is afraid to have an MRI.  She has claustrophobia.  I offered an open MRI.  She will consider this but does not want to do it until we meet again in 6 months.  Right now she wants to concentrate on weight loss.  I let her know that this has no real correlation with the findings that would come from MRI.  We talked about the potential for sudden death.  We talked about [place].  ? ?Past Medical History:  ?Diagnosis Date  ? Hyperlipidemia   ? Hypertension   ? ? ?History reviewed. No pertinent surgical history. ? ?Current Medications: ?Current Meds  ?Medication Sig  ? albuterol (VENTOLIN HFA) 108 (90 Base) MCG/ACT inhaler Inhale into the lungs as needed.  ? olmesartan-hydrochlorothiazide (BENICAR HCT) 40-25 MG tablet Take 1 tablet by mouth daily.  ? simvastatin (ZOCOR) 20 MG tablet Take 20 mg by mouth at bedtime.  ? verapamil (CALAN-SR) 240 MG CR tablet Take 1 tablet (240 mg total) by mouth at bedtime.  ?  ? ?Allergies:   Patient has no known allergies.  ? ?Social History  ? ?Socioeconomic History  ? Marital status: Divorced  ?   Spouse name: Not on file  ? Number of children: Not on file  ? Years of education: Not on file  ? Highest education level: Not on file  ?Occupational History  ? Not on file  ?Tobacco Use  ? Smoking status: Never  ? Smokeless tobacco: Never  ?Substance and Sexual Activity  ? Alcohol use: No  ? Drug use: No  ? Sexual activity: Not Currently  ?Other Topics Concern  ? Not on file  ?Social History Narrative  ? Not on file  ? ?Social Determinants of Health  ? ?Financial Resource Strain: Not on file  ?Food Insecurity: Not on file  ?Transportation Needs: Not on file  ?Physical Activity: Not on file  ?Stress: Not on file  ?Social Connections: Not on file  ?  ? ?Family History: ?The patient's family history includes Heart failure in her father and mother; Hypertension in her maternal grandfather, maternal grandmother, and mother. ? ?ROS:   ?Please see the history of present illness.    ?She feels there are breathing trouble but she ascribes it to seasonal allergies.  She denies orthopnea and PND.  Her job is sedentary with sitting at a desk.  She has no desire to become more physically active.  All other systems reviewed and are negative. ? ?EKGs/Labs/Other Studies Reviewed:   ? ?The following studies were reviewed today: ?2 D ECHOCARDIOGRAM 2017: ?Study Conclusions  ? ?- Left ventricle: The cavity size was  normal. Severe asymmetric  ?  septal hypertrophy. Peak LV outflow tract gradient 46 mmHg.  ?  Systolic function was vigorous. The estimated ejection fraction  ?  was in the range of 65% to 70%. Wall motion was normal; there  ?  were no regional wall motion abnormalities. Features are  ?  consistent with a pseudonormal left ventricular filling pattern,  ?  with concomitant abnormal relaxation and increased filling  ?  pressure (grade 2 diastolic dysfunction).  ?- Aortic valve: There was no stenosis.  ?- Mitral valve: Moderately calcified annulus. There appeared to be  ?  mild systolic anterior motion of the anterior mitral  leaflet.  ?  Hard to visualize the mitral regurgitation, ? moderate.  ?- Left atrium: The atrium was moderately dilated.  ?- Right ventricle: The cavity size was normal. Systolic function  ?  was normal.  ?- Pulmonary arteries: No complete TR doppler jet so unable to  ?  estimate PA systolic pressure.  ?- Inferior vena cava: The vessel was normal in size. The  ?  respirophasic diameter changes were in the normal range (>= 50%),  ?  consistent with normal central venous pressure.  ? ?Impressions:  ? ?- Normal LV size with severe asymmetric septal hypertrophy. EF  ?  65-70%, moderate diastolic dysfunction. There was a peak LV  ?  outflow tract gradient of 46 mmHg. Mild SAM, hard to visualize  ?  MR, ?moderate. Normal RV size and systolic function. ? ?EKG:  EKG interventricular conduction delay, left anterior hemiblock, Q waves 1 and aVL.  Left atrial abnormality. ? ?Recent Labs: ?No results found for requested labs within last 8760 hours.  ?Recent Lipid Panel ?   ?Component Value Date/Time  ? CHOL 183 02/20/2013 1106  ? TRIG 71.0 02/20/2013 1106  ? HDL 46.20 02/20/2013 1106  ? CHOLHDL 4 02/20/2013 1106  ? VLDL 14.2 02/20/2013 1106  ? LDLCALC 123 (H) 02/20/2013 1106  ? ? ?Physical Exam:   ? ?VS:  BP (!) 148/90   Pulse 76   Ht 5\' 11"  (1.803 m)   Wt 233 lb 12.8 oz (106.1 kg)   SpO2 98%   BMI 32.61 kg/m?    ? ?Wt Readings from Last 3 Encounters:  ?09/29/21 233 lb 12.8 oz (106.1 kg)  ?06/07/19 232 lb 12.8 oz (105.6 kg)  ?04/02/18 218 lb 6.2 oz (99.1 kg)  ?  ? ?GEN: Overweight. No acute distress ?HEENT: Normal ?NECK: No JVD. ?LYMPHATICS: No lymphadenopathy ?CARDIAC: 3/6 left mid sternal and right upper sternal systolic murmur. RRR S4 but no gallop, or edema. ?VASCULAR:  Normal Pulses. No bruits. ?RESPIRATORY:  Clear to auscultation without rales, wheezing or rhonchi  ?ABDOMEN: Soft, non-tender, non-distended, No pulsatile mass, ?MUSCULOSKELETAL: No deformity  ?SKIN: Warm and dry ?NEUROLOGIC:  Alert and oriented x  3 ?PSYCHIATRIC:  Normal affect  ? ?ASSESSMENT:   ? ?1. Hypertrophic obstructive cardiomyopathy (HCC)   ?2. Mixed hyperlipidemia   ?3. Essential hypertension, benign   ? ?PLAN:   ? ?In order of problems listed above: ? ?We have discussed cardiac MRI on at least 2 occasions.  She understands the information that will be obtained that could potentially help guide therapy and allow prolonged/improved outcome.  Not currently ready.  Has reservations about having an MRI.  We also discussed her 2 adult children.  She has never told them that she has this cardiac problem.  I let her know that it is inheritable and that they should at least  have auscultation done to see if her murmur is heard and would recommend that each has an echocardiogram.  She understands that not all hypertrophic cardiomyopathy is associated with a heart murmur. ?Continue simvastatin for elevated lipids. ?Continue ARB and verapamil for blood pressure control. ? ?After discussion today, have decided to see her in 6 months.  I will continue to encourage her to have work-up completed and to make sure that her children are aware of potential inheritance issues. ? ? ?Medication Adjustments/Labs and Tests Ordered: ?Current medicines are reviewed at length with the patient today.  Concerns regarding medicines are outlined above.  ?Orders Placed This Encounter  ?Procedures  ? EKG 12-Lead  ? ?No orders of the defined types were placed in this encounter. ? ? ?Patient Instructions  ?Medication Instructions:  ?Your physician recommends that you continue on your current medications as directed. Please refer to the Current Medication list given to you today. ? ?*If you need a refill on your cardiac medications before your next appointment, please call your pharmacy* ? ?Lab Work: ?NONE ? ?Testing/Procedures: ?NONE ? ?Follow-Up: ?At [facility], you and your health needs are our priority.  As part of our continuing mission to provide you with exceptional heart  care, we have created designated Provider Care Teams.  These Care Teams include your primary Cardiologist (physician) and Advanced Practice Providers (APPs -  Physician Assistants and Nurse Practitioners) who all

## 2021-09-29 ENCOUNTER — Encounter: Payer: Self-pay | Admitting: Interventional Cardiology

## 2021-09-29 ENCOUNTER — Ambulatory Visit (INDEPENDENT_AMBULATORY_CARE_PROVIDER_SITE_OTHER): Payer: BC Managed Care – PPO | Admitting: Interventional Cardiology

## 2021-09-29 VITALS — BP 148/90 | HR 76 | Ht 71.0 in | Wt 233.8 lb

## 2021-09-29 DIAGNOSIS — E782 Mixed hyperlipidemia: Secondary | ICD-10-CM | POA: Diagnosis not present

## 2021-09-29 DIAGNOSIS — I421 Obstructive hypertrophic cardiomyopathy: Secondary | ICD-10-CM | POA: Diagnosis not present

## 2021-09-29 DIAGNOSIS — I10 Essential (primary) hypertension: Secondary | ICD-10-CM

## 2021-09-29 NOTE — Patient Instructions (Signed)
Medication Instructions:  ?Your physician recommends that you continue on your current medications as directed. Please refer to the Current Medication list given to you today. ? ?*If you need a refill on your cardiac medications before your next appointment, please call your pharmacy* ? ?Lab Work: ?NONE ? ?Testing/Procedures: ?NONE ? ?Follow-Up: ?At [facility], you and your health needs are our priority.  As part of our continuing mission to provide you with exceptional heart care, we have created designated Provider Care Teams.  These Care Teams include your primary Cardiologist (physician) and Advanced Practice Providers (APPs -  Physician Assistants and Nurse Practitioners) who all work together to provide you with the care you need, when you need it. ? ?Your next appointment:   ?6 month(s) ? ?The format for your next appointment:   ?In Person ? ?Provider:   ?Sinclair Grooms, MD { ? ?Other Instructions ?Dr. Tamala Julian recommends you speak with your children about having an echocardiogram performed to check for hypertrophic obstructive cardiomyopathy (HOCM) as this can be genetically transmitted. ? ?Important Information About Sugar ? ? ? ? ?  ?

## 2022-04-04 NOTE — Progress Notes (Deleted)
Cardiology Office Note:    Date:  04/04/2022   ID:  Heather Abbott, DOB 1955-08-30, MRN [id number]  PCP:  Leia Alf, MD (Inactive)  Cardiologist:  Lesleigh Noe, MD   Referring MD: No ref. provider found   No chief complaint on file.   History of Present Illness:    Heather Abbott is a 66 y.o. female with a hx of hypertrophic obstructive cardiomyopathy, essential hypertension, and hyperlipidemia    ***  Past Medical History:  Diagnosis Date   Hyperlipidemia    Hypertension     No past surgical history on file.  Current Medications: No outpatient medications have been marked as taking for the 04/06/22 encounter (Appointment) with Lyn Records, MD.     Allergies:   Patient has no known allergies.   Social History   Socioeconomic History   Marital status: Divorced    Spouse name: Not on file   Number of children: Not on file   Years of education: Not on file   Highest education level: Not on file  Occupational History   Not on file  Tobacco Use   Smoking status: Never   Smokeless tobacco: Never  Substance and Sexual Activity   Alcohol use: No   Drug use: No   Sexual activity: Not Currently  Other Topics Concern   Not on file  Social History Narrative   Not on file   Social Determinants of Health   Financial Resource Strain: Not on file  Food Insecurity: Not on file  Transportation Needs: Not on file  Physical Activity: Not on file  Stress: Not on file  Social Connections: Not on file     Family History: The patient's family history includes Heart failure in her father and mother; Hypertension in her maternal grandfather, maternal grandmother, and mother.  ROS:   Please see the history of present illness.    *** All other systems reviewed and are negative.  EKGs/Labs/Other Studies Reviewed:    The following studies were reviewed today:  2 D Doppler ECHOCARDIOGRAM 2017: Study Conclusions   - Left ventricle: The cavity  size was normal. Severe asymmetric    septal hypertrophy. Peak LV outflow tract gradient 46 mmHg.    Systolic function was vigorous. The estimated ejection fraction    was in the range of 65% to 70%. Wall motion was normal; there    were no regional wall motion abnormalities. Features are    consistent with a pseudonormal left ventricular filling pattern,    with concomitant abnormal relaxation and increased filling    pressure (grade 2 diastolic dysfunction).  - Aortic valve: There was no stenosis.  - Mitral valve: Moderately calcified annulus. There appeared to be    mild systolic anterior motion of the anterior mitral leaflet.    Hard to visualize the mitral regurgitation, ? moderate.  - Left atrium: The atrium was moderately dilated.  - Right ventricle: The cavity size was normal. Systolic function    was normal.  - Pulmonary arteries: No complete TR doppler jet so unable to    estimate PA systolic pressure.  - Inferior vena cava: The vessel was normal in size. The    respirophasic diameter changes were in the normal range (>= 50%),    consistent with normal central venous pressure.   Impressions:   - Normal LV size with severe asymmetric septal hypertrophy. EF    65-70%, moderate diastolic dysfunction. There was a peak LV    outflow  tract gradient of 46 mmHg. Mild SAM, hard to visualize    MR, ?moderate. Normal RV size and systolic function.   EKG:  EKG ***  Recent Labs: No results found for requested labs within last 365 days.  Recent Lipid Panel    Component Value Date/Time   CHOL 183 02/20/2013 1106   TRIG 71.0 02/20/2013 1106   HDL 46.20 02/20/2013 1106   CHOLHDL 4 02/20/2013 1106   VLDL 14.2 02/20/2013 1106   LDLCALC 123 (H) 02/20/2013 1106    Physical Exam:    VS:  There were no vitals taken for this visit.    Wt Readings from Last 3 Encounters:  09/29/21 233 lb 12.8 oz (106.1 kg)  06/07/19 232 lb 12.8 oz (105.6 kg)  04/02/18 218 lb 6.2 oz (99.1 kg)      GEN: ***. No acute distress HEENT: Normal NECK: No JVD. LYMPHATICS: No lymphadenopathy CARDIAC: *** murmur. RRR *** gallop, or edema. VASCULAR: *** Normal Pulses. No bruits. RESPIRATORY:  Clear to auscultation without rales, wheezing or rhonchi  ABDOMEN: Soft, non-tender, non-distended, No pulsatile mass, MUSCULOSKELETAL: No deformity  SKIN: Warm and dry NEUROLOGIC:  Alert and oriented x 3 PSYCHIATRIC:  Normal affect   ASSESSMENT:    1. Hypertrophic obstructive cardiomyopathy (HCC)   2. Mixed hyperlipidemia   3. Essential hypertension, benign    PLAN:    In order of problems listed above:  ***   Medication Adjustments/Labs and Tests Ordered: Current medicines are reviewed at length with the patient today.  Concerns regarding medicines are outlined above.  No orders of the defined types were placed in this encounter.  No orders of the defined types were placed in this encounter.   There are no Patient Instructions on file for this visit.   Signed, Lesleigh Noe, MD  04/04/2022 3:03 PM    [facility]

## 2022-04-06 ENCOUNTER — Ambulatory Visit: Payer: BC Managed Care – PPO | Admitting: Interventional Cardiology

## 2022-04-06 DIAGNOSIS — I421 Obstructive hypertrophic cardiomyopathy: Secondary | ICD-10-CM

## 2022-04-06 DIAGNOSIS — E782 Mixed hyperlipidemia: Secondary | ICD-10-CM

## 2022-04-06 DIAGNOSIS — I10 Essential (primary) hypertension: Secondary | ICD-10-CM

## 2022-04-07 ENCOUNTER — Encounter: Payer: Self-pay | Admitting: Interventional Cardiology

## 2022-04-07 ENCOUNTER — Other Ambulatory Visit: Payer: Self-pay | Admitting: Interventional Cardiology

## 2022-04-07 ENCOUNTER — Ambulatory Visit: Payer: BC Managed Care – PPO | Attending: Interventional Cardiology | Admitting: Interventional Cardiology

## 2022-04-07 VITALS — BP 158/90 | HR 68 | Ht 71.0 in | Wt 233.4 lb

## 2022-04-07 DIAGNOSIS — E782 Mixed hyperlipidemia: Secondary | ICD-10-CM

## 2022-04-07 DIAGNOSIS — I421 Obstructive hypertrophic cardiomyopathy: Secondary | ICD-10-CM

## 2022-04-07 DIAGNOSIS — I10 Essential (primary) hypertension: Secondary | ICD-10-CM | POA: Diagnosis not present

## 2022-04-07 MED ORDER — VERAPAMIL HCL ER 360 MG PO CP24: 360.0000 mg | ORAL_CAPSULE | Freq: Every day | ORAL | 3 refills | Status: AC

## 2022-04-07 NOTE — Progress Notes (Signed)
Cardiology Office Note:    Date:  04/07/2022   ID:  [name], DOB Apr 03, 1956, MRN [id number]  PCP:  Leia Alf, MD (Inactive)  Cardiologist:  Lesleigh Noe, MD   Referring MD: No ref. provider found   Chief Complaint  Patient presents with   Congestive Heart Failure    Hypertrophic cardiomyopathy/diastolic heart failure    History of Present Illness:    Heather Abbott is a 66 y.o. female with a hx of hypertrophic obstructive cardiomyopathy, essential hypertension, and hyperlipidemia     She denies dyspnea.  No orthopnea, PND, angina, or syncope.  We have not done any imaging.  I have been trying to get an MRI but she is frightened by the possibility of claustrophobia.  Past Medical History:  Diagnosis Date   Hyperlipidemia    Hypertension     History reviewed. No pertinent surgical history.  Current Medications: Current Meds  Medication Sig   albuterol (VENTOLIN HFA) 108 (90 Base) MCG/ACT inhaler Inhale into the lungs as needed.   olmesartan-hydrochlorothiazide (BENICAR HCT) 40-25 MG tablet Take 1 tablet by mouth daily.   simvastatin (ZOCOR) 20 MG tablet Take 20 mg by mouth at bedtime.   verapamil (VERELAN PM) 360 MG 24 hr capsule Take 1 capsule (360 mg total) by mouth at bedtime.   [DISCONTINUED] verapamil (CALAN-SR) 240 MG CR tablet Take 1 tablet (240 mg total) by mouth at bedtime.     Allergies:   Patient has no known allergies.   Social History   Socioeconomic History   Marital status: Divorced    Spouse name: Not on file   Number of children: Not on file   Years of education: Not on file   Highest education level: Not on file  Occupational History   Not on file  Tobacco Use   Smoking status: Never   Smokeless tobacco: Never  Substance and Sexual Activity   Alcohol use: No   Drug use: No   Sexual activity: Not Currently  Other Topics Concern   Not on file  Social History Narrative   Not on file   Social Determinants of  Health   Financial Resource Strain: Not on file  Food Insecurity: Not on file  Transportation Needs: Not on file  Physical Activity: Not on file  Stress: Not on file  Social Connections: Not on file     Family History: The patient's family history includes Heart failure in her father and mother; Hypertension in her maternal grandfather, maternal grandmother, and mother.  ROS:   Please see the history of present illness.    Anxiety about MRI.  Still working.  No limitations.  All other systems reviewed and are negative.  EKGs/Labs/Other Studies Reviewed:    The following studies were reviewed today: No recent imaging  EKG:  EKG performed Sep 29, 2021 left ventricular hypertrophy, borderline short PR interval, leftward axis.  Recent Labs: No results found for requested labs within last 365 days.  Recent Lipid Panel    Component Value Date/Time   CHOL 183 02/20/2013 1106   TRIG 71.0 02/20/2013 1106   HDL 46.20 02/20/2013 1106   CHOLHDL 4 02/20/2013 1106   VLDL 14.2 02/20/2013 1106   LDLCALC 123 (H) 02/20/2013 1106    Physical Exam:    VS:  BP (!) 158/90   Pulse 68   Ht 5\' 11"  (1.803 m)   Wt 233 lb 6.4 oz (105.9 kg)   SpO2 96%   BMI 32.55 kg/m  Wt Readings from Last 3 Encounters:  04/07/22 233 lb 6.4 oz (105.9 kg)  09/29/21 233 lb 12.8 oz (106.1 kg)  06/07/19 232 lb 12.8 oz (105.6 kg)     GEN: Overweight. No acute distress HEENT: Normal NECK: No JVD. LYMPHATICS: No lymphadenopathy CARDIAC: 3/6 crescendo decrescendo systolic murmur left mid sternal border.  Murmur. RRR S4 gallop, or edema. VASCULAR:  Normal Pulses. No bruits. RESPIRATORY:  Clear to auscultation without rales, wheezing or rhonchi  ABDOMEN: Soft, non-tender, non-distended, No pulsatile mass, MUSCULOSKELETAL: No deformity  SKIN: Warm and dry NEUROLOGIC:  Alert and oriented x 3 PSYCHIATRIC:  Normal affect   ASSESSMENT:    1. Hypertrophic obstructive cardiomyopathy (HCC)   2. Mixed  hyperlipidemia   3. Essential hypertension, benign    PLAN:    In order of problems listed above:  Has not been properly risk stratified.  We will order 2D Doppler echocardiogram in absence of a cardiac MRI which we have tried to arrange now for several years.  She feels that she is asymptomatic. Continue statin therapy Increase verapamil to 360 mg/day for better blood pressure control.  May need to be further uptitrated.   Referred to Dr. Ladona Ridgel for longitudinal follow-up.  She should see him in 6 months.   Medication Adjustments/Labs and Tests Ordered: Current medicines are reviewed at length with the patient today.  Concerns regarding medicines are outlined above.  Orders Placed This Encounter  Procedures   ECHOCARDIOGRAM COMPLETE   Meds ordered this encounter  Medications   verapamil (VERELAN PM) 360 MG 24 hr capsule    Sig: Take 1 capsule (360 mg total) by mouth at bedtime.    Dispense:  90 capsule    Refill:  3    Patient Instructions  Medication Instructions:  Your physician has recommended you make the following change in your medication:   1) INCREASE verapamil (Verelan PM) to 360mg  daily  *If you need a refill on your cardiac medications before your next appointment, please call your pharmacy*  Lab Work: NONE  Testing/Procedures: Your physician has requested that you have an echocardiogram. Echocardiography is a painless test that uses sound waves to create images of your heart. It provides your doctor with information about the size and shape of your heart and how well your heart's chambers and valves are working. This procedure takes approximately one hour. There are no restrictions for this procedure. Please do NOT wear cologne, perfume, aftershave, or lotions (deodorant is allowed). Please arrive 15 minutes prior to your appointment time.  Follow-Up: At [facility], you and your health needs are our priority.  As part of our  continuing mission to provide you with exceptional heart care, we have created designated Provider Care Teams.  These Care Teams include your primary Cardiologist (physician) and Advanced Practice Providers (APPs -  Physician Assistants and Nurse Practitioners) who all work together to provide you with the care you need, when you need it.  Your next appointment:   6 month(s)  The format for your next appointment:   In Person  Provider:   [facility], MD  Important Information About Sugar         Signed, Riley Lam, MD  04/07/2022 3:23 PM    [facility]

## 2022-04-07 NOTE — Patient Instructions (Addendum)
Medication Instructions:  Your physician has recommended you make the following change in your medication:   1) INCREASE verapamil (Verelan PM) to 360mg  daily  *If you need a refill on your cardiac medications before your next appointment, please call your pharmacy*  Lab Work: NONE  Testing/Procedures: Your physician has requested that you have an echocardiogram. Echocardiography is a painless test that uses sound waves to create images of your heart. It provides your doctor with information about the size and shape of your heart and how well your heart's chambers and valves are working. This procedure takes approximately one hour. There are no restrictions for this procedure. Please do NOT wear cologne, perfume, aftershave, or lotions (deodorant is allowed). Please arrive 15 minutes prior to your appointment time.  Follow-Up: At [facility], you and your health needs are our priority.  As part of our continuing mission to provide you with exceptional heart care, we have created designated Provider Care Teams.  These Care Teams include your primary Cardiologist (physician) and Advanced Practice Providers (APPs -  Physician Assistants and Nurse Practitioners) who all work together to provide you with the care you need, when you need it.  Your next appointment:   6 month(s)  The format for your next appointment:   In Person  Provider:   [facility], MD  Important Information About Sugar

## 2022-04-30 ENCOUNTER — Ambulatory Visit (HOSPITAL_COMMUNITY): Payer: BC Managed Care – PPO | Attending: Interventional Cardiology

## 2022-04-30 DIAGNOSIS — I421 Obstructive hypertrophic cardiomyopathy: Secondary | ICD-10-CM | POA: Insufficient documentation

## 2022-04-30 LAB — ECHOCARDIOGRAM COMPLETE
Area-P 1/2: 2.79 cm2
S' Lateral: 1.7 cm

## 2022-05-21 ENCOUNTER — Telehealth: Payer: Self-pay | Admitting: Interventional Cardiology

## 2022-05-21 DIAGNOSIS — Z01812 Encounter for preprocedural laboratory examination: Secondary | ICD-10-CM

## 2022-05-21 DIAGNOSIS — I421 Obstructive hypertrophic cardiomyopathy: Secondary | ICD-10-CM

## 2022-05-21 MED ORDER — ALPRAZOLAM 0.25 MG PO TABS: ORAL_TABLET | ORAL | 0 refills | Status: DC

## 2022-05-21 NOTE — Telephone Encounter (Signed)
-----   Message from Lyn Records, MD sent at 05/14/2022  2:38 PM EST -----  Alprazolam 0.25 mg two tablets po, 30-60 minutes prior to study. Prescribe a total of 3 tablets. If the first dosing does not seem effective after 30-40 minutes, okay to take the third tablet.  She will not be able to drive for 12 hours after taking the medication. ----- Message ----- From: Franchot Gallo Sent: 05/12/2022   5:52 PM EST To: Lyn Records, MD  Patient agreeable to proceeding with MRI, would like to premedicate with anxiolytic. Dr. Katrinka Blazing, please let me know what you would like to prescribe and the dosage and I will order with cardiac MRI.  The patient has been notified of the result and verbalized understanding.  All questions (if any) were answered. Franchot Gallo, RN 05/12/2022 5:48 PM

## 2022-05-21 NOTE — Telephone Encounter (Signed)
Left message for patient informing her Dr. Katrinka Blazing is prescribing alprazolam 0.25mg  (2 tablets 30-60 minutes prior to Cardiac MRI, may take an additional 1 tablet if first dose note effective after 30-40 minutes, do not drive for 12 hours after taking this medication). Cardiac MRI ordered, scheduler to call patient and setup appointment for procedure and pre-procedure lab (CBC already ordered).   Provided office number for callback if any questions.

## 2022-06-29 DIAGNOSIS — I10 Essential (primary) hypertension: Secondary | ICD-10-CM | POA: Diagnosis not present

## 2022-06-29 DIAGNOSIS — Z0000 Encounter for general adult medical examination without abnormal findings: Secondary | ICD-10-CM | POA: Diagnosis not present

## 2022-06-29 DIAGNOSIS — I422 Other hypertrophic cardiomyopathy: Secondary | ICD-10-CM | POA: Diagnosis not present

## 2022-06-29 DIAGNOSIS — E785 Hyperlipidemia, unspecified: Secondary | ICD-10-CM | POA: Diagnosis not present

## 2022-07-27 DIAGNOSIS — Z1211 Encounter for screening for malignant neoplasm of colon: Secondary | ICD-10-CM | POA: Diagnosis not present

## 2022-07-27 DIAGNOSIS — I422 Other hypertrophic cardiomyopathy: Secondary | ICD-10-CM | POA: Diagnosis not present

## 2022-07-27 DIAGNOSIS — I10 Essential (primary) hypertension: Secondary | ICD-10-CM | POA: Diagnosis not present

## 2022-07-31 ENCOUNTER — Telehealth (HOSPITAL_COMMUNITY): Payer: Self-pay | Admitting: *Deleted

## 2022-07-31 NOTE — Telephone Encounter (Signed)
Calling patient about her cardiac MRI. Patient states that MRI cost too much for her at this time. She would like to schedule it out a few months so she has time get her finances in order. Informed her we would cancel her appt for now and someone from scheduling will reach out.  Gordy Clement RN Navigator Cardiac Imaging [facility] [phone] Office [phone] Cell

## 2022-08-03 ENCOUNTER — Ambulatory Visit (HOSPITAL_COMMUNITY): Admission: RE | Admit: 2022-08-03 | Payer: BC Managed Care – PPO | Source: Ambulatory Visit

## 2022-09-28 ENCOUNTER — Encounter: Payer: Self-pay | Admitting: Gastroenterology

## 2022-10-05 ENCOUNTER — Encounter: Payer: Self-pay | Admitting: Internal Medicine

## 2022-10-05 ENCOUNTER — Ambulatory Visit (INDEPENDENT_AMBULATORY_CARE_PROVIDER_SITE_OTHER): Payer: BC Managed Care – PPO

## 2022-10-05 ENCOUNTER — Ambulatory Visit: Payer: BC Managed Care – PPO | Attending: Internal Medicine | Admitting: Internal Medicine

## 2022-10-05 VITALS — BP 140/80 | HR 73 | Ht 70.0 in | Wt 235.0 lb

## 2022-10-05 DIAGNOSIS — I10 Essential (primary) hypertension: Secondary | ICD-10-CM

## 2022-10-05 DIAGNOSIS — I493 Ventricular premature depolarization: Secondary | ICD-10-CM | POA: Diagnosis not present

## 2022-10-05 DIAGNOSIS — I421 Obstructive hypertrophic cardiomyopathy: Secondary | ICD-10-CM

## 2022-10-05 MED ORDER — LORAZEPAM 1 MG PO TABS: ORAL_TABLET | ORAL | 0 refills | Status: DC

## 2022-10-05 NOTE — Patient Instructions (Signed)
Medication Instructions:  Your physician recommends that you continue on your current medications as directed. Please refer to the Current Medication list given to you today.  *If you need a refill on your cardiac medications before your next appointment, please call your pharmacy*   Lab Work: In the next Week: CBC If you have labs (blood work) drawn today and your tests are completely normal, you will receive your results only by: MyChart Message (if you have MyChart) OR A paper copy in the mail If you have any lab test that is abnormal or we need to change your treatment, we will call you to review the results.   Testing/Procedures: Your physician has requested that you have an exercise tolerance test. For further information please visit [email or web address] Please also follow instruction sheet, as given.     Follow-Up: At [facility], you and your health needs are our priority.  As part of our continuing mission to provide you with exceptional heart care, we have created designated Provider Care Teams.  These Care Teams include your primary Cardiologist (physician) and Advanced Practice Providers (APPs -  Physician Assistants and Nurse Practitioners) who all work together to provide you with the care you need, when you need it.  We recommend signing up for the patient portal called "MyChart".  Sign up information is provided on this After Visit Summary.  MyChart is used to connect with patients for Virtual Visits (Telemedicine).  Patients are able to view lab/test results, encounter notes, upcoming appointments, etc.  Non-urgent messages can be sent to your provider as well.   To learn more about what you can do with MyChart, go to [email or web address].    Your next appointment:   5 month(s)  Provider:   Christell Constant, MD     Other Instructions   You are scheduled for Cardiac MRI on ______________. Please arrive for your appointment at ______________ (  arrive 30-45 minutes prior to test start time). ?  [facility] [address] [place], [place] [address] [phone] Please take advantage of the free valet parking available at the MAIN entrance (A entrance).  Proceed to the [facility] Radiology Department (First Floor) for check-in.   OR   [facility] [address] [place], [place] [address] [phone] Please take advantage of the free valet parking available at the MAIN entrance. Proceed to [facility] registration for check-in (first floor).  Magnetic resonance imaging (MRI) is a painless test that produces images of the inside of the body without using Xrays.  During an MRI, strong magnets and radio waves work together in a Data processing manager to form detailed images.   MRI images may provide more details about a medical condition than X-rays, CT scans, and ultrasounds can provide.  You may be given earphones to listen for instructions.  You may eat a light breakfast and take medications as ordered with the exception of furosemide, hydrochlorothiazide (Benicar), or spironolactone(fluid pill, other).   Please avoid stimulants for 12 hr prior to test. (Ie. Caffeine, nicotine, chocolate, or antihistamine medications)  If a contrast material will be used, an IV will be inserted into one of your veins. Contrast material will be injected into your IV. It will leave your body through your urine within a day. You may be told to drink plenty of fluids to help flush the contrast material out of your system.  You will be asked to remove all metal, including: Watch, jewelry, and other metal objects  including hearing aids, hair pieces and dentures. Also wearable glucose monitoring systems (ie. Freestyle Libre and Omnipods) (Braces and fillings normally are not a problem.)   TEST WILL TAKE APPROXIMATELY 1 HOUR  PLEASE NOTIFY SCHEDULING AT LEAST 24 HOURS IN ADVANCE IF YOU ARE UNABLE TO KEEP YOUR  APPOINTMENT. [phone]  Please call Rockwell Alexandria, cardiac imaging nurse navigator with any questions/concerns. Rockwell Alexandria RN Navigator Cardiac Imaging Larey Brick RN Navigator Cardiac Imaging [facility] [phone] Office      ZIO XT- Long Term Monitor Instructions  Your physician has requested you wear a ZIO patch monitor for 7 days.  This is a single patch monitor. [employer] supplies one patch monitor per enrollment. Additional stickers are not available. Please do not apply patch if you will be having a Nuclear Stress Test,   Cardiac CT, MRI, or Chest Xray during the period you would be wearing the  monitor. The patch cannot be worn during these tests. You cannot remove and re-apply the  ZIO XT patch monitor.  Your ZIO patch monitor will be mailed 3 day USPS to your address on file. It may take 3-5 days  to receive your monitor after you have been enrolled.  Once you have received your monitor, please review the enclosed instructions. Your monitor  has already been registered assigning a specific monitor serial # to you.  Billing and Patient Assistance Program Information  We have supplied [employer] with any of your insurance information on file for billing purposes. [employer] offers a sliding scale Patient Assistance Program for patients that do not have  insurance, or whose insurance does not completely cover the cost of the ZIO monitor.  You must apply for the Patient Assistance Program to qualify for this discounted rate.  To apply, please call [employer] at [phone], select option 4, select option 2, ask to apply for  Patient Assistance Program. Meredeth Ide will ask your household income, and how many people  are in your household. They will quote your out-of-pocket cost based on that information.  [employer] will also be able to set up a 55-month, interest-free payment plan if needed.  Applying the monitor   Shave hair from upper left chest.   Hold abrader disc by orange tab. Rub abrader in 40 strokes over the upper left chest as  indicated in your monitor instructions.  Clean area with 4 enclosed alcohol pads. Let dry.  Apply patch as indicated in monitor instructions. Patch will be placed under collarbone on left  side of chest with arrow pointing upward.  Rub patch adhesive wings for 2 minutes. Remove white label marked "1". Remove the white  label marked "2". Rub patch adhesive wings for 2 additional minutes.  While looking in a mirror, press and release button in center of patch. A small green light will  flash 3-4 times. This will be your only indicator that the monitor has been turned on.  Do not shower for the first 24 hours. You may shower after the first 24 hours.  Press the button if you feel a symptom. You will hear a small click. Record Date, Time and  Symptom in the Patient Logbook.  When you are ready to remove the patch, follow instructions on the last 2 pages of Patient  Logbook. Stick patch monitor onto the last page of Patient Logbook.  Place Patient Logbook in the blue and white box. Use locking tab on box and tape box closed  securely. The blue and white  box has prepaid postage on it. Please place it in the mailbox as  soon as possible. Your physician should have your test results approximately 7 days after the  monitor has been mailed back to [facility].  Call [facility] Customer Care at [phone] if you have questions regarding  your ZIO XT patch monitor. Call them immediately if you see an orange light blinking on your  monitor.  If your monitor falls off in less than 4 days, contact our Monitor department at [phone].  If your monitor becomes loose or falls off after 4 days call [employer] at [phone] for  suggestions on securing your monitor

## 2022-10-05 NOTE — Progress Notes (Unsigned)
Enrolled for [employer] to mail a ZIO XT long term holter monitor to the patients address on file.  

## 2022-10-05 NOTE — Progress Notes (Signed)
Cardiology Office Note:    Date:  10/05/2022   ID:  Heather Abbott, DOB 1955/09/11, MRN [id number]  PCP:  Leia Alf, MD (Inactive)  Cardiologist:  Christell Constant, MD   Referring MD: Lyn Records, MD   CC: HCM Consulted for the evaluation of mid Cavitary gradient HCM at the behest of Dr. Katrinka Blazing  History of Present Illness:    Heather Abbott is a 67 y.o. female with a hx of hypertrophic obstructive cardiomyopathy, essential hypertension, and hyperlipidemia   Patient notes that is is doing great. No SOB at rest and  DOE Notes no fatigue. Notes no palpitations Notes no CP. Notes no Dizziness. Notes no syncope.  Has previously tried higher dose diuretic with BB and felt poorly.  She was diagnosed years ago and is followed with Dr. Katrinka Blazing. Her mother died suddenly of unclear causes. She finds some of her disease process scary, and does not know a lot that can be done about it, and so does not often think about her disease. She does not exercise; has some fear about SCD related to HCM.  She has two daugthers - one is s/p kidney pancrease transplant; no evidence of HCM - one daugther in [place] has not been screened  Her BP is usually 140/80 or greater    Past Medical History:  Diagnosis Date   Hyperlipidemia    Hypertension     No past surgical history on file.  Current Medications: Current Meds  Medication Sig   albuterol (VENTOLIN HFA) 108 (90 Base) MCG/ACT inhaler Inhale into the lungs as needed.   ALPRAZolam (XANAX) 0.25 MG tablet Take 2 tablets (0.5 mg total) by mouth 30-60 minutes prior to cardiac MRI. If this dose is not effective after 30-40 minutes may take an additional 1 tablet. Do not drive for 12 hours after taking this medication.   LORazepam (ATIVAN) 1 MG tablet Take -1 hour before Cardiac Mri   olmesartan-hydrochlorothiazide (BENICAR HCT) 40-25 MG tablet Take 1 tablet by mouth daily.   simvastatin (ZOCOR) 20 MG tablet  Take 20 mg by mouth at bedtime.   verapamil (VERELAN PM) 360 MG 24 hr capsule Take 1 capsule (360 mg total) by mouth at bedtime.     Allergies:   Patient has no known allergies.   Social History   Socioeconomic History   Marital status: Divorced    Spouse name: Not on file   Number of children: Not on file   Years of education: Not on file   Highest education level: Not on file  Occupational History   Not on file  Tobacco Use   Smoking status: Never   Smokeless tobacco: Never  Substance and Sexual Activity   Alcohol use: No   Drug use: No   Sexual activity: Not Currently  Other Topics Concern   Not on file  Social History Narrative   Not on file   Social Determinants of Health   Financial Resource Strain: Not on file  Food Insecurity: Not on file  Transportation Needs: Not on file  Physical Activity: Not on file  Stress: Not on file  Social Connections: Not on file     Family History: The patient's family history includes Heart failure in her father and mother; Hypertension in her maternal grandfather, maternal grandmother, and mother.  ROS:   Please see the history of present illness.    Anxiety about MRI.  Still working.  No limitations.  All other systems reviewed and  are negative.  EKGs/Labs/Other Studies Reviewed:    The following studies were reviewed today: No recent imaging  EKG:  EKG performed Sep 29, 2021 left ventricular hypertrophy, borderline short PR interval, leftward axis.  Recent Labs: No results found for requested labs within last 365 days.  Recent Lipid Panel    Component Value Date/Time   CHOL 183 02/20/2013 1106   TRIG 71.0 02/20/2013 1106   HDL 46.20 02/20/2013 1106   CHOLHDL 4 02/20/2013 1106   VLDL 14.2 02/20/2013 1106   LDLCALC 123 (H) 02/20/2013 1106    Physical Exam:    VS:  BP (!) 140/80   Pulse 73   Ht 5\' 10"  (1.778 m)   Wt 235 lb (106.6 kg)   SpO2 97%   BMI 33.72 kg/m     Wt Readings from Last 3 Encounters:   10/05/22 235 lb (106.6 kg)  04/07/22 233 lb 6.4 oz (105.9 kg)  09/29/21 233 lb 12.8 oz (106.1 kg)     GEN: Overweight. No acute distress HEENT: Normal NECK: No JVD. CARDIAC: 3/6 crescendo decrescendo systolic murmur left mid sternal border worse with hand grip,  VASCULAR:  Normal Pulses. RESPIRATORY:  Clear to auscultation without rales, wheezing or rhonchi  ABDOMEN: Soft, non-tender, non-distended, No pulsatile mass, MUSCULOSKELETAL: No deformity  SKIN: Warm and dry NEUROLOGIC:  Alert and oriented x 3 PSYCHIATRIC:  Normal affect   ASSESSMENT:    1. Hypertrophic obstructive cardiomyopathy (HCC)   2. PVC (premature ventricular contraction)   3. Essential hypertension     PLAN:    Hypertrophic Cardiomyopathy PVCs - Mid Cavitary Variant without apical aneurysm - peak gradient 17 mm Hg (LVOT) on Valsalva  - with moderate MR with horizontal splay artifact - suspicion of Fabry's/Danon or other mimics of HCM: low - Gene variant: NA  - NYHA I - Non HCM Contributors to disease/status (HTN) - she is on ARB therapy in the spirit of INHERIT trial; she is NYHA I  Family history reviewed, Discussed family screening  (Her daughter will get screening echo; she will talk with her daugther and may pursue genetic testing)  SCD  Assessment - will get POET - Echo from 2023 notable for septal thickness ~ 27 mm - CMR pending - will assess for PVCs, NSVT, and AF  Medication symptom plan - CCB, BB added for HTN - if worsening symptoms will consider stress echo +/- clinical trial eval  Genetic Therapy discussion: NA  Fall f/u for SCD discussion and to eval exercise therapy   Non Cardiac HCM Care HLD- continue statin     Time Spent Directly with Patient:   I have spent a total of 40 minueswith the patient reviewing notes, imaging, EKGs, labs and examining the patient as well as establishing an assessment and plan that was discussed personally with the patient.  > 50% of time  was spent in direct patient careand reviewing imaging with patient.    Medication Adjustments/Labs and Tests Ordered: Current medicines are reviewed at length with the patient today.  Concerns regarding medicines are outlined above.  Orders Placed This Encounter  Procedures   CBC   EXERCISE TOLERANCE TEST (ETT)   LONG TERM MONITOR (3-14 DAYS)   EKG 12-Lead   Meds ordered this encounter  Medications   LORazepam (ATIVAN) 1 MG tablet    Sig: Take -1 hour before Cardiac Mri    Dispense:  1 tablet    Refill:  0    Patient Instructions  Medication Instructions:  Your physician recommends that you continue on your current medications as directed. Please refer to the Current Medication list given to you today.  *If you need a refill on your cardiac medications before your next appointment, please call your pharmacy*   Lab Work: In the next Week: CBC If you have labs (blood work) drawn today and your tests are completely normal, you will receive your results only by: MyChart Message (if you have MyChart) OR A paper copy in the mail If you have any lab test that is abnormal or we need to change your treatment, we will call you to review the results.   Testing/Procedures: Your physician has requested that you have an exercise tolerance test. For further information please visit [email or web address] Please also follow instruction sheet, as given.     Follow-Up: At [facility], you and your health needs are our priority.  As part of our continuing mission to provide you with exceptional heart care, we have created designated Provider Care Teams.  These Care Teams include your primary Cardiologist (physician) and Advanced Practice Providers (APPs -  Physician Assistants and Nurse Practitioners) who all work together to provide you with the care you need, when you need it.  We recommend signing up for the patient portal called "MyChart".  Sign up information is provided  on this After Visit Summary.  MyChart is used to connect with patients for Virtual Visits (Telemedicine).  Patients are able to view lab/test results, encounter notes, upcoming appointments, etc.  Non-urgent messages can be sent to your provider as well.   To learn more about what you can do with MyChart, go to [email or web address].    Your next appointment:   5 month(s)  Provider:   Christell Constant, MD     Other Instructions   You are scheduled for Cardiac MRI on ______________. Please arrive for your appointment at ______________ ( arrive 30-45 minutes prior to test start time). ?  [facility] [address] [place], [place] [address] [phone] Please take advantage of the free valet parking available at the MAIN entrance (A entrance).  Proceed to the [facility] Radiology Department (First Floor) for check-in.   OR   [facility] [address] [place], [place] [address] [phone] Please take advantage of the free valet parking available at the MAIN entrance. Proceed to [facility] registration for check-in (first floor).  Magnetic resonance imaging (MRI) is a painless test that produces images of the inside of the body without using Xrays.  During an MRI, strong magnets and radio waves work together in a Data processing manager to form detailed images.   MRI images may provide more details about a medical condition than X-rays, CT scans, and ultrasounds can provide.  You may be given earphones to listen for instructions.  You may eat a light breakfast and take medications as ordered with the exception of furosemide, hydrochlorothiazide (Benicar), or spironolactone(fluid pill, other).   Please avoid stimulants for 12 hr prior to test. (Ie. Caffeine, nicotine, chocolate, or antihistamine medications)  If a contrast material will be used, an IV will be inserted into one of your veins. Contrast material will be injected into your IV. It  will leave your body through your urine within a day. You may be told to drink plenty of fluids to help flush the contrast material out of your system.  You will be asked to remove all metal, including: Watch, jewelry, and other metal objects including hearing  aids, hair pieces and dentures. Also wearable glucose monitoring systems (ie. Freestyle Libre and Omnipods) (Braces and fillings normally are not a problem.)   TEST WILL TAKE APPROXIMATELY 1 HOUR  PLEASE NOTIFY SCHEDULING AT LEAST 24 HOURS IN ADVANCE IF YOU ARE UNABLE TO KEEP YOUR APPOINTMENT. [phone]  Please call Rockwell Alexandria, cardiac imaging nurse navigator with any questions/concerns. Rockwell Alexandria RN Navigator Cardiac Imaging Larey Brick RN Navigator Cardiac Imaging [facility] [phone] Office      ZIO XT- Long Term Monitor Instructions  Your physician has requested you wear a ZIO patch monitor for 7 days.  This is a single patch monitor. [employer] supplies one patch monitor per enrollment. Additional stickers are not available. Please do not apply patch if you will be having a Nuclear Stress Test,   Cardiac CT, MRI, or Chest Xray during the period you would be wearing the  monitor. The patch cannot be worn during these tests. You cannot remove and re-apply the  ZIO XT patch monitor.  Your ZIO patch monitor will be mailed 3 day USPS to your address on file. It may take 3-5 days  to receive your monitor after you have been enrolled.  Once you have received your monitor, please review the enclosed instructions. Your monitor  has already been registered assigning a specific monitor serial # to you.  Billing and Patient Assistance Program Information  We have supplied [employer] with any of your insurance information on file for billing purposes. [employer] offers a sliding scale Patient Assistance Program for patients that do not have  insurance, or whose insurance does not completely cover the  cost of the ZIO monitor.  You must apply for the Patient Assistance Program to qualify for this discounted rate.  To apply, please call [employer] at [phone], select option 4, select option 2, ask to apply for  Patient Assistance Program. Meredeth Ide will ask your household income, and how many people  are in your household. They will quote your out-of-pocket cost based on that information.  [employer] will also be able to set up a 79-month, interest-free payment plan if needed.  Applying the monitor   Shave hair from upper left chest.  Hold abrader disc by orange tab. Rub abrader in 40 strokes over the upper left chest as  indicated in your monitor instructions.  Clean area with 4 enclosed alcohol pads. Let dry.  Apply patch as indicated in monitor instructions. Patch will be placed under collarbone on left  side of chest with arrow pointing upward.  Rub patch adhesive wings for 2 minutes. Remove white label marked "1". Remove the white  label marked "2". Rub patch adhesive wings for 2 additional minutes.  While looking in a mirror, press and release button in center of patch. A small green light will  flash 3-4 times. This will be your only indicator that the monitor has been turned on.  Do not shower for the first 24 hours. You may shower after the first 24 hours.  Press the button if you feel a symptom. You will hear a small click. Record Date, Time and  Symptom in the Patient Logbook.  When you are ready to remove the patch, follow instructions on the last 2 pages of Patient  Logbook. Stick patch monitor onto the last page of Patient Logbook.  Place Patient Logbook in the blue and white box. Use locking tab on box and tape box closed  securely. The blue and white box has prepaid  postage on it. Please place it in the mailbox as  soon as possible. Your physician should have your test results approximately 7 days after the  monitor has been mailed back to [facility].  Call [facility]  Customer Care at [phone] if you have questions regarding  your ZIO XT patch monitor. Call them immediately if you see an orange light blinking on your  monitor.  If your monitor falls off in less than 4 days, contact our Monitor department at [phone].  If your monitor becomes loose or falls off after 4 days call [employer] at [phone] for  suggestions on securing your monitor    Signed, Christell Constant, MD  10/05/2022 5:45 PM    [facility]

## 2022-10-12 ENCOUNTER — Telehealth: Payer: Self-pay | Admitting: Internal Medicine

## 2022-10-12 DIAGNOSIS — I421 Obstructive hypertrophic cardiomyopathy: Secondary | ICD-10-CM | POA: Diagnosis not present

## 2022-10-12 DIAGNOSIS — I493 Ventricular premature depolarization: Secondary | ICD-10-CM | POA: Diagnosis not present

## 2022-10-12 NOTE — Telephone Encounter (Signed)
Patient stated she is wearing a Zio heart monitor and wants to know if she would be able to do the CUP EXERCISE TOLERANCE TEST on 5/23.

## 2022-10-12 NOTE — Telephone Encounter (Signed)
Left a message to call back.

## 2022-10-14 NOTE — Telephone Encounter (Signed)
Pt states she is starting work soon and would like for you to leave a vm with information if possible.   Informed her Heather Abbott is not in today and she ask that someone else c/b before tomorrow. She just needs to know if its okay for her to continue wearing her heart monitor during her exercise test tomorrow or should she r/s her exercise test until heart monitor is complete.

## 2022-10-14 NOTE — Telephone Encounter (Signed)
Left detailed message for patient as requested informing her that she can have her exercise tolerance test (stress test) tomorrow 5/23 while wearing Zio heart monitor. As long as she has been wearing it for more than 24 hours there are no contraindications.  Provided office number for callback if any questions.

## 2022-10-15 ENCOUNTER — Ambulatory Visit: Payer: BC Managed Care – PPO

## 2022-10-15 ENCOUNTER — Ambulatory Visit: Payer: BC Managed Care – PPO | Attending: Internal Medicine

## 2022-10-15 DIAGNOSIS — I421 Obstructive hypertrophic cardiomyopathy: Secondary | ICD-10-CM | POA: Diagnosis not present

## 2022-10-15 DIAGNOSIS — I493 Ventricular premature depolarization: Secondary | ICD-10-CM | POA: Diagnosis not present

## 2022-10-16 ENCOUNTER — Telehealth: Payer: Self-pay | Admitting: *Deleted

## 2022-10-16 ENCOUNTER — Ambulatory Visit: Payer: BC Managed Care – PPO

## 2022-10-16 ENCOUNTER — Other Ambulatory Visit: Payer: BC Managed Care – PPO

## 2022-10-16 LAB — CBC
Hematocrit: 35.3 % (ref 34.0–46.6)
Hemoglobin: 11.7 g/dL (ref 11.1–15.9)
MCH: 29.5 pg (ref 26.6–33.0)
MCHC: 33.1 g/dL (ref 31.5–35.7)
MCV: 89 fL (ref 79–97)
Platelets: 180 10*3/uL (ref 150–450)
RBC: 3.96 x10E6/uL (ref 3.77–5.28)
RDW: 13.2 % (ref 11.7–15.4)
WBC: 4.5 10*3/uL (ref 3.4–10.8)

## 2022-10-16 NOTE — Telephone Encounter (Signed)
Attempt to reach pt for pre-visit. LM with call back #. No other # to try in profile Will attempt to reach again in 5 min due to no other # listed in profile   Second attempt to reach pt for pre-vist unsuccessful. LM with facility # for pt to call back. Instructed pt to call # given by end of the day and reschedule the pre-visit or the scheduled procedure will be canceled.  RN will verify if pt has rescheduled pre-visit or not at end of day and if no per protocol Procedure will be canceled,

## 2022-10-16 NOTE — Progress Notes (Unsigned)
Pt's name and DOB verified at the beginning of the pre-visit.  Pt denies any difficulty with ambulating,sitting, laying down or rolling side to side Gave both [facility] main # and MD on call # prior to instructions.  No egg or soy allergy known to patient  No issues known to pt with past sedation with any surgeries or procedures Pt denies having issues being intubated Patient denies ever being intubated Pt has no issues moving head neck or swallowing No FH of Malignant Hyperthermia Pt is not on diet pills Pt is not on home 02  Pt is not on blood thinners  Pt denies issues with constipation  Pt has frequent issues with constipation RN instructed pt to use Miralax per bottles instructions a week before prep days. Pt states they will Pt is not on dialysis Pt denise any abnormal heart rhythms  Pt denies any upcoming cardiac testing Pt encouraged to use to use Singlecare or Goodrx to reduce cost  Patient's chart reviewed by John Nulty CNRA prior to pre-visit and patient appropriate for the [facility].  Pre-visit completed and red dot placed by patient's name on their procedure day (on provider's schedule).  . Visit by phone Pt states weight is  Instructed pt why it is important to and  to call if they have any changes in health or new medications. Directed them to the # given and on instructions.   Pt states they will.  Instructions reviewed with pt and pt states understanding. Instructed to review again prior to procedure. Pt states they will.  Instructions sent by mail with coupon and by my chart   

## 2022-10-21 LAB — EXERCISE TOLERANCE TEST
Angina Index: 0
Duke Treadmill Score: 3
Estimated workload: 4
Exercise duration (min): 2 min
Exercise duration (sec): 46 s
MPHR: 153 {beats}/min
Peak HR: 133 {beats}/min
Percent HR: 86 %
RPE: 15
Rest HR: 74 {beats}/min
ST Depression (mm): 0 mm

## 2022-10-23 ENCOUNTER — Encounter: Payer: Self-pay | Admitting: Gastroenterology

## 2022-10-23 ENCOUNTER — Telehealth: Payer: Self-pay | Admitting: Internal Medicine

## 2022-10-23 NOTE — Telephone Encounter (Signed)
[employer] FMLA form and payment received.  Form in Dr. Debby Bud box.

## 2022-10-26 ENCOUNTER — Telehealth (HOSPITAL_COMMUNITY): Payer: Self-pay | Admitting: *Deleted

## 2022-10-26 NOTE — Telephone Encounter (Signed)
Reaching out to patient to offer assistance regarding upcoming cardiac imaging study; pt verbalizes understanding of appt date/time, parking situation and where to check in, pre-test NPO status and medications ordered, and verified current allergies; name and call back number provided for further questions should they arise  Larey Brick RN Navigator Cardiac Imaging [facility] [phone] office [phone] cell  Patient has Xanax to take for test. She is aware she will need a driver. She denies metal.

## 2022-10-26 NOTE — Telephone Encounter (Signed)
Attempted to call patient regarding upcoming cardiac MRI appointment. Left message on voicemail with name and callback number  Boris Engelmann RN Navigator Cardiac Imaging [facility] [phone] Office [phone] Cell  

## 2022-10-27 ENCOUNTER — Ambulatory Visit (HOSPITAL_COMMUNITY): Admission: RE | Admit: 2022-10-27 | Payer: BC Managed Care – PPO | Source: Ambulatory Visit

## 2022-10-30 NOTE — Telephone Encounter (Signed)
Called pt advised of MD response.  Pt reports did not complete testing d/t $2500 cost.  Reports is not asking for FMLA but ADA accommodations.  Pt would like paperwork mailed to her and fee refunded.  Placed red folder with sticky note at front desk for f/u.

## 2022-10-30 NOTE — Telephone Encounter (Signed)
Spoke with patient. She will come here to pick up her $29 cash refund.  I sent her form back to her.

## 2022-10-30 NOTE — Telephone Encounter (Signed)
Left a message to call back.   Would like to discuss with pt that she missed 10/27/22 Cmri appointment.  MD currently has no medical reason to address FMLA.  Pt should f/u with PCP or psychology.

## 2022-10-30 NOTE — Telephone Encounter (Signed)
Follow Up:      Patient is returning [facility]' call. She wanted her to know that the form she need is not FMLA, it is ADA. She says if Alena Bills has any specific questions, please leave her a voice message please.. She says she might be at work when she calls back/

## 2022-11-02 ENCOUNTER — Encounter: Payer: BC Managed Care – PPO | Admitting: Gastroenterology

## 2022-11-04 ENCOUNTER — Encounter: Payer: Self-pay | Admitting: Gastroenterology

## 2022-11-04 ENCOUNTER — Ambulatory Visit (AMBULATORY_SURGERY_CENTER): Payer: BC Managed Care – PPO

## 2022-11-04 VITALS — Ht 70.0 in | Wt 230.0 lb

## 2022-11-04 DIAGNOSIS — Z1211 Encounter for screening for malignant neoplasm of colon: Secondary | ICD-10-CM

## 2022-11-04 MED ORDER — NA SULFATE-K SULFATE-MG SULF 17.5-3.13-1.6 GM/177ML PO SOLN: 1.0000 | Freq: Once | ORAL | 0 refills | Status: AC

## 2022-11-04 NOTE — Progress Notes (Signed)

## 2022-11-06 ENCOUNTER — Telehealth: Payer: Self-pay | Admitting: Internal Medicine

## 2022-11-06 NOTE — Telephone Encounter (Signed)
Patient called to let Leanne in Registration know she has her permission to mail the money to her home address.

## 2022-12-04 ENCOUNTER — Encounter: Payer: BC Managed Care – PPO | Admitting: Gastroenterology

## 2022-12-04 DIAGNOSIS — Z1211 Encounter for screening for malignant neoplasm of colon: Secondary | ICD-10-CM | POA: Diagnosis not present

## 2023-03-10 ENCOUNTER — Ambulatory Visit: Payer: BC Managed Care – PPO | Admitting: Internal Medicine

## 2023-07-02 DIAGNOSIS — J45998 Other asthma: Secondary | ICD-10-CM | POA: Diagnosis not present

## 2023-07-02 DIAGNOSIS — Z23 Encounter for immunization: Secondary | ICD-10-CM | POA: Diagnosis not present

## 2023-07-02 DIAGNOSIS — E785 Hyperlipidemia, unspecified: Secondary | ICD-10-CM | POA: Diagnosis not present

## 2023-07-02 DIAGNOSIS — Z0000 Encounter for general adult medical examination without abnormal findings: Secondary | ICD-10-CM | POA: Diagnosis not present

## 2023-07-02 DIAGNOSIS — I10 Essential (primary) hypertension: Secondary | ICD-10-CM | POA: Diagnosis not present

## 2023-07-02 DIAGNOSIS — J302 Other seasonal allergic rhinitis: Secondary | ICD-10-CM | POA: Diagnosis not present

## 2023-07-07 ENCOUNTER — Other Ambulatory Visit: Payer: Self-pay | Admitting: Internal Medicine

## 2023-07-07 DIAGNOSIS — Z1231 Encounter for screening mammogram for malignant neoplasm of breast: Secondary | ICD-10-CM

## 2023-07-23 ENCOUNTER — Ambulatory Visit
Admission: RE | Admit: 2023-07-23 | Discharge: 2023-07-23 | Disposition: A | Discharge status: Home / Self Care | Payer: BC Managed Care – PPO | Source: Ambulatory Visit | Attending: Internal Medicine | Admitting: Internal Medicine

## 2023-07-23 DIAGNOSIS — Z1231 Encounter for screening mammogram for malignant neoplasm of breast: Secondary | ICD-10-CM

## 2023-09-17 DIAGNOSIS — I10 Essential (primary) hypertension: Secondary | ICD-10-CM | POA: Diagnosis not present

## 2023-09-17 DIAGNOSIS — E785 Hyperlipidemia, unspecified: Secondary | ICD-10-CM | POA: Diagnosis not present

## 2023-09-17 DIAGNOSIS — I422 Other hypertrophic cardiomyopathy: Secondary | ICD-10-CM | POA: Diagnosis not present

## 2024-01-06 ENCOUNTER — Encounter (INDEPENDENT_AMBULATORY_CARE_PROVIDER_SITE_OTHER): Payer: Self-pay | Admitting: Primary Care

## 2024-01-06 ENCOUNTER — Ambulatory Visit (INDEPENDENT_AMBULATORY_CARE_PROVIDER_SITE_OTHER): Admitting: Primary Care

## 2024-01-06 VITALS — BP 161/89 | HR 86 | Resp 19 | Ht 70.0 in | Wt 239.6 lb

## 2024-01-06 DIAGNOSIS — Z6834 Body mass index (BMI) 34.0-34.9, adult: Secondary | ICD-10-CM

## 2024-01-06 DIAGNOSIS — E782 Mixed hyperlipidemia: Secondary | ICD-10-CM | POA: Diagnosis not present

## 2024-01-06 DIAGNOSIS — Z7689 Persons encountering health services in other specified circumstances: Secondary | ICD-10-CM

## 2024-01-06 DIAGNOSIS — E559 Vitamin D deficiency, unspecified: Secondary | ICD-10-CM

## 2024-01-06 DIAGNOSIS — Z1211 Encounter for screening for malignant neoplasm of colon: Secondary | ICD-10-CM

## 2024-01-06 DIAGNOSIS — E6609 Other obesity due to excess calories: Secondary | ICD-10-CM | POA: Diagnosis not present

## 2024-01-06 DIAGNOSIS — J321 Chronic frontal sinusitis: Secondary | ICD-10-CM

## 2024-01-06 DIAGNOSIS — I10 Essential (primary) hypertension: Secondary | ICD-10-CM

## 2024-01-06 DIAGNOSIS — E66811 Obesity, class 1: Secondary | ICD-10-CM | POA: Diagnosis not present

## 2024-01-06 NOTE — Progress Notes (Signed)
 New Patient Office Visit  Subjective    Patient ID: Heather Abbott female  DOB: 10-Dec-1955  Age: 68 y.o. MRN: [id number]   CC:  Concern about chronic sinusitis with nose   HPI  Ms.Heather Abbott is a 68 year old obese female in today  Current Outpatient Medications on File Prior to Visit  Medication Sig Dispense Refill   albuterol (VENTOLIN HFA) 108 (90 Base) MCG/ACT inhaler Inhale into the lungs as needed.     olmesartan-hydrochlorothiazide (BENICAR HCT) 40-25 MG tablet Take 1 tablet by mouth daily.  1   simvastatin (ZOCOR) 20 MG tablet Take 20 mg by mouth at bedtime.     verapamil (VERELAN PM) 360 MG 24 hr capsule Take 1 capsule (360 mg total) by mouth at bedtime. 90 capsule 3   fluticasone (FLONASE ALLERGY RELIEF) 50 MCG/ACT nasal spray Place 2 sprays into both nostrils daily.     No current facility-administered medications on file prior to visit.     Allergies  Allergen Reactions   Baclofen Nausea Only   Tramadol Hcl Nausea Only    Past Medical History:  Diagnosis Date   Hyperlipidemia    Hypertension      History reviewed. No pertinent surgical history.   Family History  Problem Relation Age of Onset   Hypertension Mother    Heart failure Mother    Heart failure Father    Hypertension Maternal Grandmother    Hypertension Maternal Grandfather    Colon cancer Neg Hx    Colon polyps Neg Hx    Esophageal cancer Neg Hx    Rectal cancer Neg Hx    Stomach cancer Neg Hx     Social History   Socioeconomic History   Marital status: Divorced    Spouse name: Not on file   Number of children: Not on file   Years of education: Not on file   Highest education level: Not on file  Occupational History   Not on file  Tobacco Use   Smoking status: Never   Smokeless tobacco: Never  Vaping Use   Vaping status: Never Used  Substance and Sexual Activity   Alcohol use: No   Drug use: No   Sexual activity: Not Currently  Other Topics Concern   Not on file   Social History Narrative   Not on file   Social Drivers of Health   Financial Resource Strain: Not on file  Food Insecurity: Not on file  Transportation Needs: Not on file  Physical Activity: Not on file  Stress: Not on file  Social Connections: Not on file  Intimate Partner Violence: Not on file       Health Maintenance  Topic Date Due   Hepatitis C Screening  Never done   Cologuard (Stool DNA test)  Never done   DEXA scan (bone density measurement)  Never done   COVID-19 Vaccine (4 - 2024-25 season) 01/22/2024*   Flu Shot  03/24/2024*   Zoster (Shingles) Vaccine (1 of 2) 04/07/2024*   Pneumococcal Vaccine for age over 75 (1 of 2 - PCV) 01/05/2025*   Mammogram  07/22/2025   DTaP/Tdap/Td vaccine (3 - Td or Tdap) 07/01/2033   HPV Vaccine  Aged Out   Meningitis B Vaccine  Aged Out  *Topic was postponed. The date shown is not the original due date.    Objective    BP (!) 161/89 (BP Location: Left Arm, Patient Position: Sitting, Cuff Size: Normal)   Pulse 86  Resp 19   Ht 5' 10 (1.778 m)   Wt 239 lb 9.6 oz (108.7 kg)   SpO2 100%   BMI 34.38 kg/m     Physical Exam General: No apparent distress. Eyes: Extraocular eye movements intact, pupils equal and round. Neck: Supple, trachea midline. Thyroid: No enlargement, mobile without fixation, no tenderness. Cardiovascular:.systolic murmur left mid sternal border.  Respiratory: Normal respiratory effort, clear to auscultation. Gastrointestinal: Normal pitch active bowel sounds, nontender abdomen without distention or appreciable hepatomegaly. Neurologic:  deep tendon reflexes  Musculoskeletal: Normal muscle tone, no tenderness on palpation of tibia, no excessive thoracic kyphosis. Skin: Appropriate warmth, no visible rash. Mental status: Alert, conversant, speech clear, thought logical, appropriate mood and affect, no hallucinations or delusions evident. Hematologic/lymphatic: No cervical adenopathy, no visible  ecchymoses.    Assessment & Plan:   Heather Abbott was seen today for new patient (initial visit).  Diagnoses and all orders for this visit:  Encounter to establish care  Colon cancer screening -     Cologuard  Essential hypertension BP goal - < 140/90 Explained that having normal blood pressure is the goal and medications are helping to get to goal and maintain normal blood pressure. DIET: Limit salt intake, read nutrition labels to check salt content, limit fried and high fatty foods  Avoid using multisymptom OTC cold preparations that generally contain sudafed which can rise BP. Consult with pharmacist on best cold relief products to use for persons with HTN EXERCISE Discussed incorporating exercise such as walking - 30 minutes most days of the week and can do in 10 minute intervals     Mixed hyperlipidemia On statin   Class 1 obesity due to excess calories with serious comorbidity and body mass index (BMI) of 34.0 to 34.9 in adult Chronic frontal sinusitis -     Ambulatory referral to ENT      Follow-up:  Return in about 3 months (around 04/07/2024).  The above assessment and management plan was discussed with the patient. The patient verbalized understanding of and has agreed to the management plan. Patient is aware to call the clinic if symptoms fail to improve or worsen. Patient is aware when to return to the clinic for a follow-up visit. Patient educated on when it is appropriate to go to the emergency department.   Heather Bohr, NP-C

## 2024-01-06 NOTE — Patient Instructions (Addendum)
 Bone Density Test: What to Expect A bone density test uses a type of X-ray to measure the amount of calcium and other minerals in your bones. It can measure bone density in the hip and the spine. This test may also be called: Bone densitometry. Bone mineral density test. Dual-energy X-ray absorptiometry (DEXA). You may have this test to: Diagnose or screen for a condition that causes weak or thin bones, called osteoporosis. See what your risk is for a broken bone, also called a fracture. Check how well your treatment for weak or thin bones is working. The test is similar to having a regular X-ray. Tell a health care provider about: Any allergies you have. All medicines you're taking, including vitamins, herbs, eye drops, creams, and over-the-counter medicines. Any problems you or family members have had with anesthesia. Any bleeding problems you have. Any surgeries you've had. Any medical conditions you have. Whether you're pregnant or may be pregnant. Any medical tests you've had within the past 14 days that used contrast. What are the risks? Your health care provider will talk with you about risks. These may include: Being exposed to a small amount of radiation. This can slightly increase your cancer risk. What happens before the test? Do not take any calcium supplements within the 24 hours before your test. You'll need to take off: All metal jewelry. Eyeglasses. Removable dental appliances. Any other metal objects on your body. What happens during the test?  You'll lie down on an exam table. There will be an X-ray machine below you and an imaging device above you. Other devices, such as boxes or braces, may be used to position your body for the scan. The machine will slowly scan your body. You'll need to keep very still while the machine does the scan. The images will show up on a screen in the room. Images will be checked by a specialist after your test is finished. These  steps may vary. Ask what you can expect. What can I expect after the test? Ask when your results will be ready and how to get them. You may need to call or meet with your provider to get your results. This information is not intended to replace advice given to you by your health care provider. Make sure you discuss any questions you have with your health care provider. Document Revised: 09/20/2022 Document Reviewed: 09/20/2022 Elsevier Patient Education  2024 [employer] BP goal - < 140/90 Explained that having normal blood pressure is the goal and medications are helping to get to goal and maintain normal blood pressure. DIET: Limit salt intake, read nutrition labels to check salt content, limit fried and high fatty foods  Avoid using multisymptom OTC cold preparations that generally contain sudafed which can rise BP. Consult with pharmacist on best cold relief products to use for persons with HTN EXERCISE Discussed incorporating exercise such as walking - 30 minutes most days of the week and can do in 10 minute intervals

## 2024-01-10 ENCOUNTER — Ambulatory Visit (INDEPENDENT_AMBULATORY_CARE_PROVIDER_SITE_OTHER): Admitting: Primary Care

## 2024-01-10 DIAGNOSIS — E782 Mixed hyperlipidemia: Secondary | ICD-10-CM | POA: Diagnosis not present

## 2024-01-10 DIAGNOSIS — I10 Essential (primary) hypertension: Secondary | ICD-10-CM | POA: Diagnosis not present

## 2024-01-10 DIAGNOSIS — J321 Chronic frontal sinusitis: Secondary | ICD-10-CM

## 2024-01-10 DIAGNOSIS — E559 Vitamin D deficiency, unspecified: Secondary | ICD-10-CM

## 2024-01-11 LAB — CMP14+EGFR
ALT: 17 IU/L (ref 0–32)
AST: 19 IU/L (ref 0–40)
Albumin: 4.2 g/dL (ref 3.9–4.9)
Alkaline Phosphatase: 75 IU/L (ref 44–121)
BUN/Creatinine Ratio: 19 (ref 12–28)
BUN: 18 mg/dL (ref 8–27)
Bilirubin Total: 0.4 mg/dL (ref 0.0–1.2)
CO2: 23 mmol/L (ref 20–29)
Calcium: 9.8 mg/dL (ref 8.7–10.3)
Chloride: 103 mmol/L (ref 96–106)
Creatinine, Ser: 0.94 mg/dL (ref 0.57–1.00)
Globulin, Total: 2.5 g/dL (ref 1.5–4.5)
Glucose: 88 mg/dL (ref 70–99)
Potassium: 4.1 mmol/L (ref 3.5–5.2)
Sodium: 140 mmol/L (ref 134–144)
Total Protein: 6.7 g/dL (ref 6.0–8.5)
eGFR: 66 mL/min/1.73 (ref >59)

## 2024-01-11 LAB — CBC WITH DIFFERENTIAL/PLATELET
Basophils Absolute: 0 x10E3/uL (ref 0.0–0.2)
Basos: 1 %
EOS (ABSOLUTE): 0.1 x10E3/uL (ref 0.0–0.4)
Eos: 2 %
Hematocrit: 39.1 % (ref 34.0–46.6)
Hemoglobin: 12.1 g/dL (ref 11.1–15.9)
Immature Grans (Abs): 0 x10E3/uL (ref 0.0–0.1)
Immature Granulocytes: 0 %
Lymphocytes Absolute: 1.7 x10E3/uL (ref 0.7–3.1)
Lymphs: 36 %
MCH: 29.1 pg (ref 26.6–33.0)
MCHC: 30.9 g/dL — ABNORMAL LOW (ref 31.5–35.7)
MCV: 94 fL (ref 79–97)
Monocytes Absolute: 0.4 x10E3/uL (ref 0.1–0.9)
Monocytes: 9 %
Neutrophils Absolute: 2.4 x10E3/uL (ref 1.4–7.0)
Neutrophils: 52 %
Platelets: 199 x10E3/uL (ref 150–450)
RBC: 4.16 x10E6/uL (ref 3.77–5.28)
RDW: 13.6 % (ref 11.7–15.4)
WBC: 4.6 x10E3/uL (ref 3.4–10.8)

## 2024-01-11 LAB — LIPID PANEL
Chol/HDL Ratio: 3.1 ratio (ref 0.0–4.4)
Cholesterol, Total: 169 mg/dL (ref 100–199)
HDL: 54 mg/dL (ref >39)
LDL Chol Calc (NIH): 103 mg/dL — ABNORMAL HIGH (ref 0–99)
Triglycerides: 62 mg/dL (ref 0–149)
VLDL Cholesterol Cal: 12 mg/dL (ref 5–40)

## 2024-01-11 LAB — VITAMIN D 25 HYDROXY (VIT D DEFICIENCY, FRACTURES): Vit D, 25-Hydroxy: 17.2 ng/mL — ABNORMAL LOW (ref 30.0–100.0)

## 2024-01-14 ENCOUNTER — Encounter (INDEPENDENT_AMBULATORY_CARE_PROVIDER_SITE_OTHER): Payer: Self-pay

## 2024-01-14 ENCOUNTER — Ambulatory Visit (INDEPENDENT_AMBULATORY_CARE_PROVIDER_SITE_OTHER): Payer: Self-pay | Admitting: Primary Care

## 2024-01-16 NOTE — Progress Notes (Signed)
 Labs

## 2024-01-17 ENCOUNTER — Other Ambulatory Visit (INDEPENDENT_AMBULATORY_CARE_PROVIDER_SITE_OTHER): Payer: Self-pay | Admitting: Primary Care

## 2024-01-17 DIAGNOSIS — E559 Vitamin D deficiency, unspecified: Secondary | ICD-10-CM

## 2024-01-17 NOTE — Telephone Encounter (Unsigned)
 Copied from CRM #[id number]. Topic: Clinical - Medication Refill >> Jan 17, 2024 10:39 AM Tobias L wrote: Medication: vitamin D   Patient received mychart message stating vitamin D  prescription would be sent in, patient states she has not received it from the pharmacy. Requesting prescription to be sent in.   Has the patient contacted their pharmacy? Yes Advised to reach out to office.   This is the patient's preferred pharmacy:  [id number] - [place], [place] - [address] AT [facility] [address] [place] [address] Phone: [phone] Fax: [phone]  Is this the correct pharmacy for this prescription? Yes If no, delete pharmacy and type the correct one.   Has the prescription been filled recently? No  Is the patient out of the medication? Yes  Has the patient been seen for an appointment in the last year OR does the patient have an upcoming appointment? Yes  Can we respond through MyChart? Yes  Agent: Please be advised that Rx refills may take up to 3 business days. We ask that you follow-up with your pharmacy.

## 2024-01-17 NOTE — Telephone Encounter (Signed)
 vitamin D  is not on current medication list, routing for review.

## 2024-01-18 ENCOUNTER — Other Ambulatory Visit (INDEPENDENT_AMBULATORY_CARE_PROVIDER_SITE_OTHER): Payer: Self-pay | Admitting: *Deleted

## 2024-01-18 ENCOUNTER — Other Ambulatory Visit (INDEPENDENT_AMBULATORY_CARE_PROVIDER_SITE_OTHER): Payer: Self-pay | Admitting: Primary Care

## 2024-01-18 ENCOUNTER — Telehealth: Payer: Self-pay

## 2024-01-18 ENCOUNTER — Encounter (INDEPENDENT_AMBULATORY_CARE_PROVIDER_SITE_OTHER): Payer: Self-pay | Admitting: *Deleted

## 2024-01-18 MED ORDER — ERGOCALCIFEROL 1.25 MG (50000 UT) PO CAPS: 50000.0000 [IU] | ORAL_CAPSULE | ORAL | 0 refills | Status: DC

## 2024-01-18 MED ORDER — VITAMIN D3 50 MCG (2000 UT) PO CAPS: 2000.0000 [IU] | ORAL_CAPSULE | Freq: Every day | ORAL | 1 refills | Status: AC

## 2024-01-18 MED ORDER — ERGOCALCIFEROL 1.25 MG (50000 UT) PO CAPS: 50000.0000 [IU] | ORAL_CAPSULE | ORAL | 0 refills | Status: AC

## 2024-01-18 NOTE — Telephone Encounter (Signed)
 Copied from CRM #[id number]. Topic: General - Other >> Jan 17, 2024 10:41 AM Tobias CROME wrote: Reason for CRM: Patient left a form at last visit for provider to complete. Patient inquiring when she will be able to pick up form.   Requesting callback number: [phone]

## 2024-01-20 NOTE — Telephone Encounter (Signed)
 Message was routed with the MyChart note on 01/18/2024.  Awaiting PCP reply.

## 2024-01-28 ENCOUNTER — Telehealth (INDEPENDENT_AMBULATORY_CARE_PROVIDER_SITE_OTHER): Payer: Self-pay | Admitting: Primary Care

## 2024-01-28 ENCOUNTER — Encounter (INDEPENDENT_AMBULATORY_CARE_PROVIDER_SITE_OTHER): Payer: Self-pay

## 2024-01-28 NOTE — Telephone Encounter (Signed)
 Called pt to schedule an appt so that provider could fill out paperwork. Pt did not answer and LVM. Please advise

## 2024-02-01 IMAGING — MG MM DIGITAL SCREENING BILAT W/ TOMO AND CAD
8 series · 8 of 24 positions shown · non-contrast
Comparison: Previous exam(s).

CLINICAL DATA: Screening.

EXAM:
DIGITAL SCREENING BILATERAL MAMMOGRAM WITH TOMOSYNTHESIS AND CAD
TECHNIQUE: Bilateral screening digital craniocaudal and mediolateral oblique
mammograms were obtained. Bilateral screening digital breast
tomosynthesis was performed. The images were evaluated with
computer-aided detection.

[R CC synth-2D]
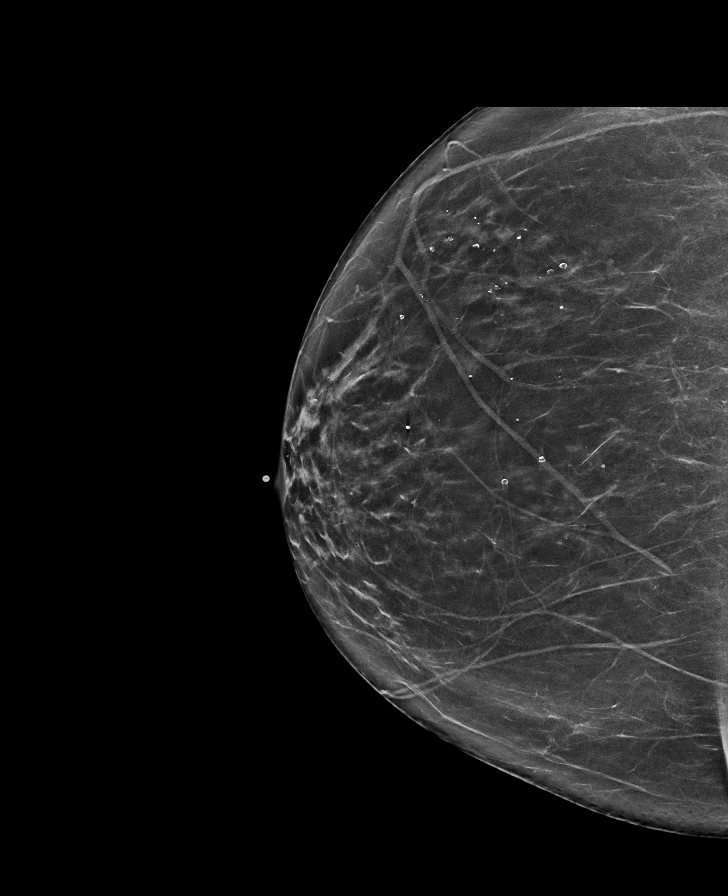

[R MLO synth-2D]
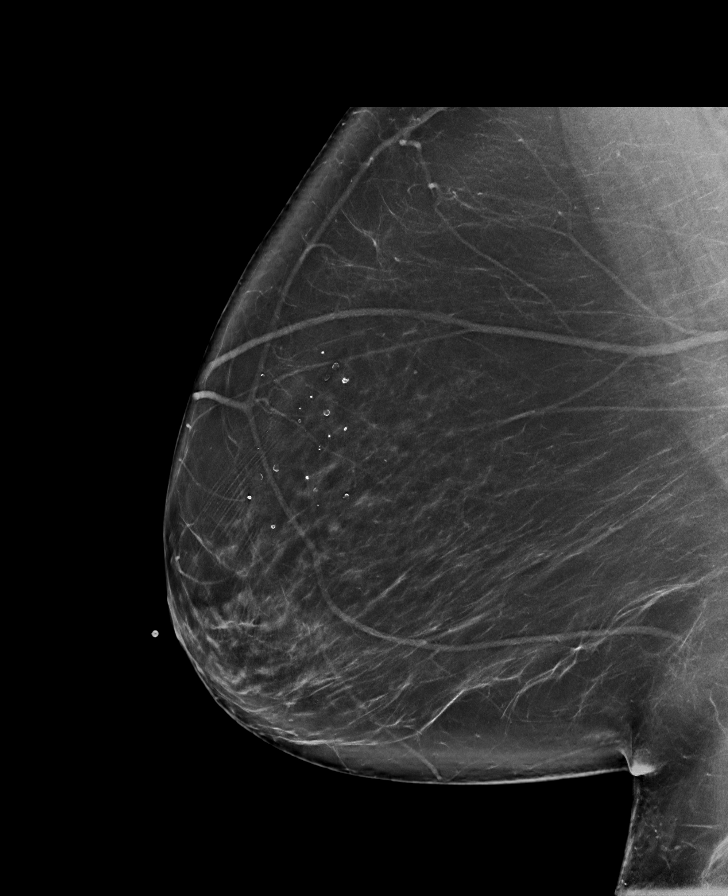

[L MLO synth-2D]
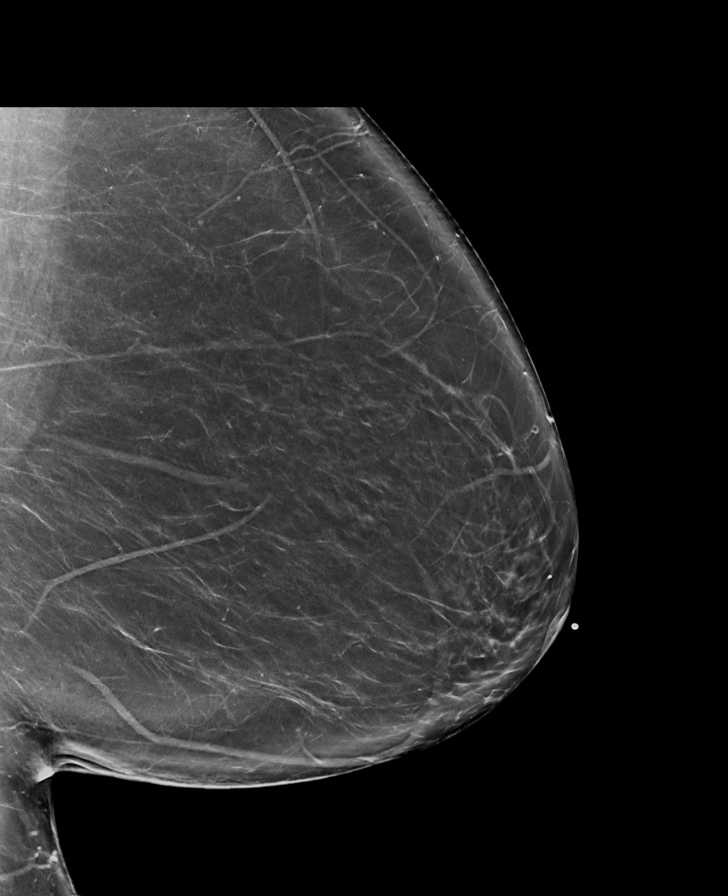

[L CC synth-2D]
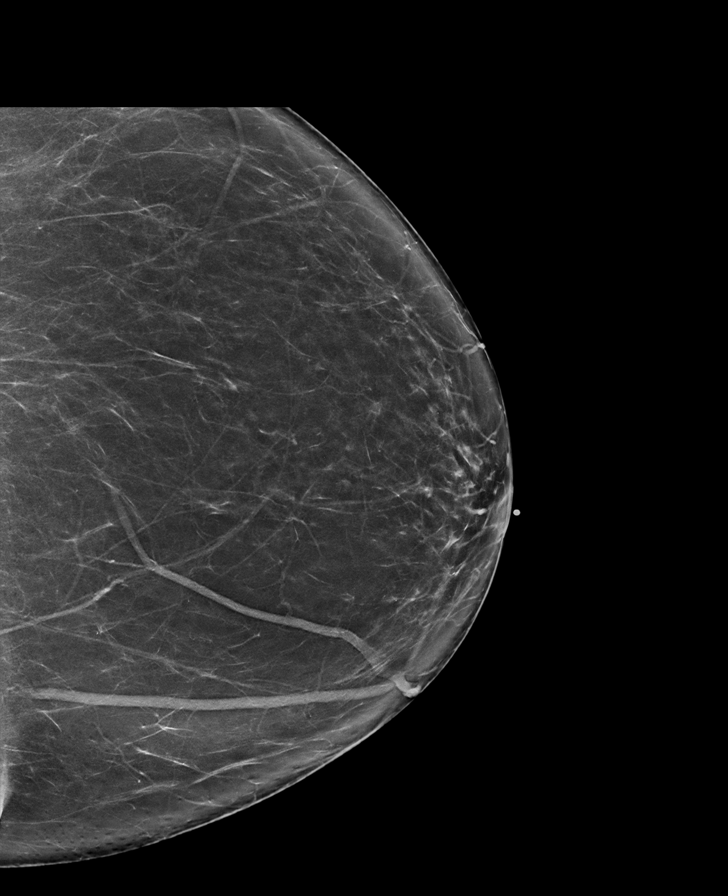

[L MLO tomo · tomo slice 49/97.0]
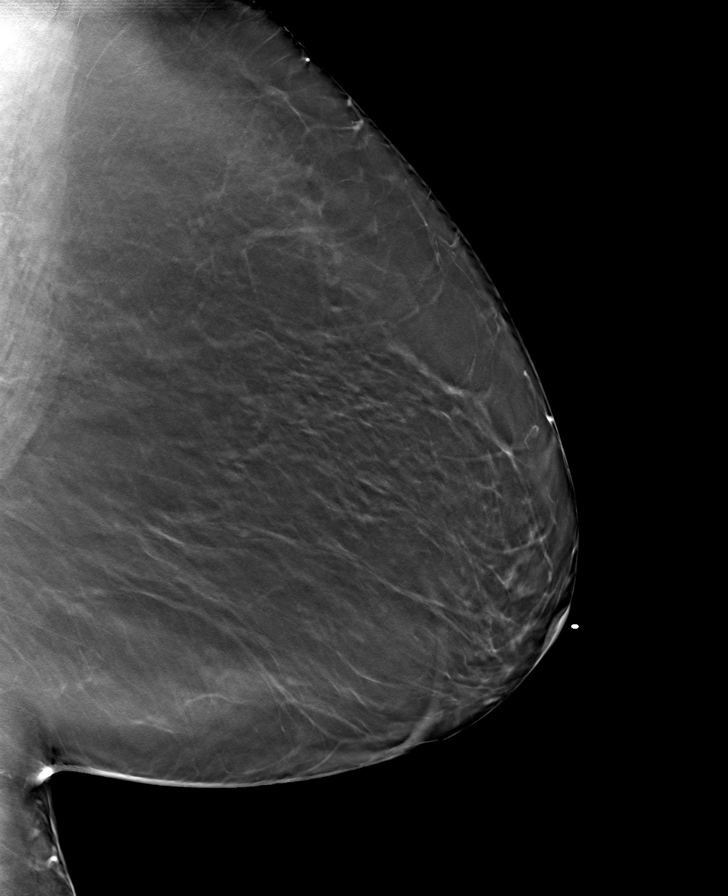

[R MLO tomo · tomo slice 51/100.0]
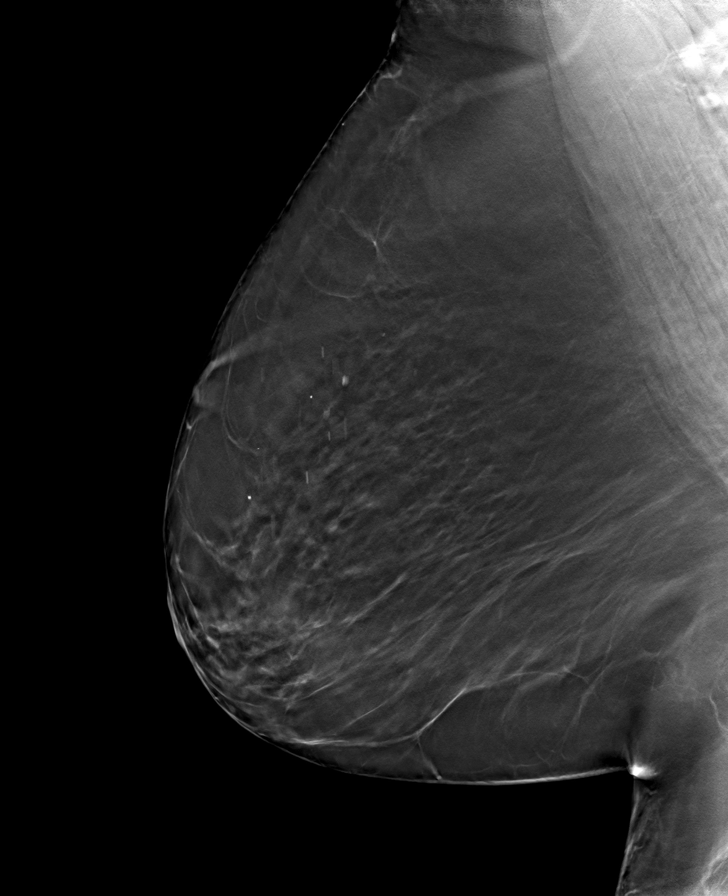

[L CC tomo · tomo slice 43/84.0]
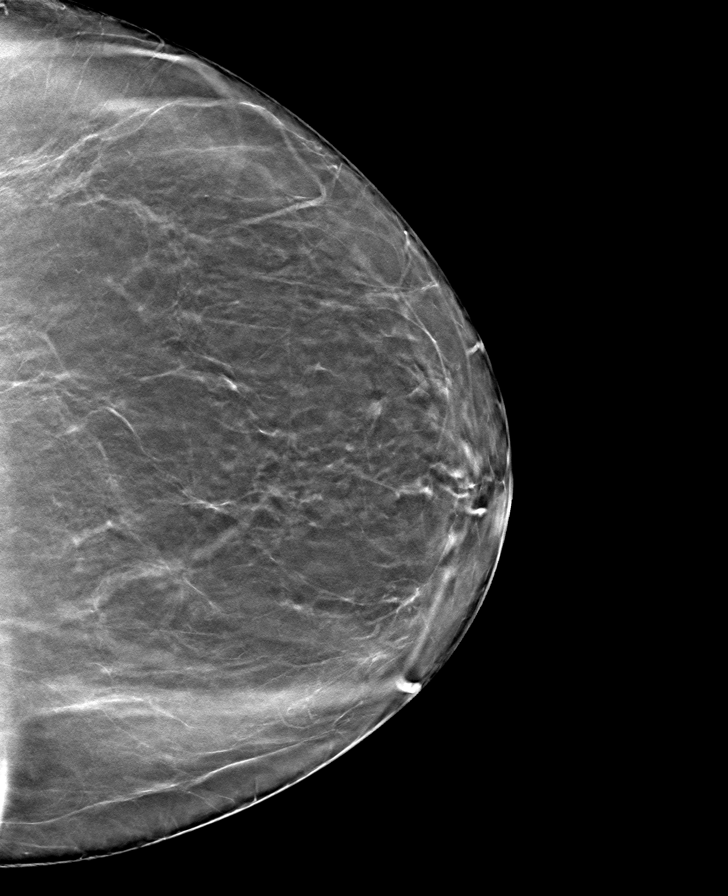

[R CC tomo · tomo slice 39/76.0]
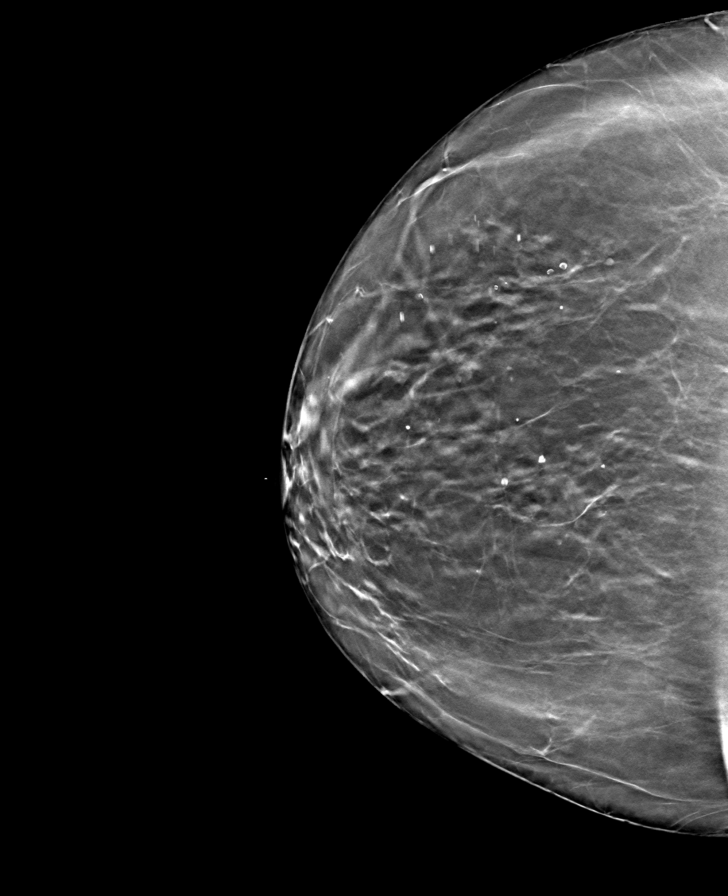

[8 of 24 positions shown; findings below may reference images not displayed]

ACR Breast Density Category b: There are scattered areas of
fibroglandular density.
FINDINGS: There are no findings suspicious for malignancy.
IMPRESSION: No mammographic evidence of malignancy. A result letter of this
screening mammogram will be mailed directly to the patient.

RECOMMENDATION:
Screening mammogram in one year. (Code:51-O-LD2)

BI-RADS CATEGORY  1: Negative.

## 2024-02-08 ENCOUNTER — Encounter (INDEPENDENT_AMBULATORY_CARE_PROVIDER_SITE_OTHER): Payer: Self-pay

## 2024-02-15 ENCOUNTER — Ambulatory Visit (INDEPENDENT_AMBULATORY_CARE_PROVIDER_SITE_OTHER): Payer: BC Managed Care – PPO | Admitting: Dermatology

## 2024-02-15 ENCOUNTER — Encounter: Payer: Self-pay | Admitting: Dermatology

## 2024-02-15 VITALS — BP 142/80

## 2024-02-15 DIAGNOSIS — L6681 Central centrifugal cicatricial alopecia: Secondary | ICD-10-CM | POA: Diagnosis not present

## 2024-02-15 DIAGNOSIS — L649 Androgenic alopecia, unspecified: Secondary | ICD-10-CM

## 2024-02-15 MED ORDER — SAFETY SEAL MISCELLANEOUS MISC: 5 refills | Status: AC

## 2024-02-15 NOTE — Progress Notes (Signed)
   New Patient Visit   Subjective  Heather Abbott is a 68 y.o. female who presents for a NEW PATIENT appointment to be examined for the concerns as listed below.   Alopecia: Pt stated she noticed an area of concern around 10 years ago but during the pandemic she noticed the hair loss had gotten worse. In 2022 she started experiencing scalp burning that she rated 8 out of 10 but it has resolved 3 months ago. Her stylist recommended she be seen by a dermatologist for recommendations. She has been using biotin spray since May and taking OTC oral supplements - she has seen improvement in the crown but not at the rate she would like. No Family Hx of hair loss.   No Hx of Bx. No family Hx of skin cancer.   The following portions of the chart were reviewed this encounter and updated as appropriate: medications, allergies, medical history  Review of Systems:  No other skin or systemic complaints except as noted in HPI or Assessment and Plan.  Objective  Well appearing patient in no apparent distress; mood and affect are within normal limits.   A focused examination was performed of the following areas: scalp   Relevant exam findings are noted in the Assessment and Plan.             Assessment & Plan   Central centrifugal cicatricial alopecia (CCCA) Chronic hair loss at the vertex with inflammation causing scarring and permanent follicle loss. Goal: prevent progression, preserve hair. Regrowth in scarred areas unlikely. - Prescribed compounded topical solution with clobetasol, minoxidil, finasteride for morning application. - Educated on morning application to prevent facial hair growth. - Discussed four-month timeline for noticeable changes. - Explained compounded medication not covered by insurance, mailed from specialty pharmacy. - Advised optional supplements: Vital Proteins collagen, Viviscal.  Androgenetic alopecia Hair thinning at temples and frontal hairline due to hormonal  changes, characterized by follicle miniaturization. Potential for regrowth if hair not pulled tightly. - Included in treatment with compounded topical solution containing clobetasol, minoxidil, finasteride. - Advised against tight hairstyles to prevent hair loss exacerbation. ANDROGENETIC ALOPECIA   Related Medications Safety Seal Miscellaneous MISC Minoxidil 8% Clobetasol 0.05% Finasteride 1% solution - apply to the affected areas of the scalp QAM. CENTRAL CENTRIFUGAL CICATRICIAL ALOPECIA   Related Medications Safety Seal Miscellaneous MISC Minoxidil 8% Clobetasol 0.05% Finasteride 1% solution - apply to the affected areas of the scalp QAM.  Return in about 4 months (around 06/16/2024) for ALOPECIA, CCCA.   Documentation: I have reviewed the above documentation for accuracy and completeness, and I agree with the above.  I, Shirron Maranda, CMA, am acting as scribe for [employer], DO.   Delon Lenis, DO

## 2024-02-15 NOTE — Patient Instructions (Addendum)
 VISIT SUMMARY:  Today, you were seen for hair thinning and scalp burning. You reported that the hair thinning started at the crown and recently involved the front of your scalp. You also experienced a burning sensation on your scalp intermittently for three months. We discussed your hair care history, including the use of chemical relaxers, braids, and your current natural ponytail style. There is no strong family history of hair thinning.  YOUR PLAN:  -CENTRAL CENTRIFUGAL CICATRICIAL ALOPECIA (CCCA):  CCCA is a type of chronic hair loss at the top of the scalp that causes scarring and permanent follicle loss. Our goal is to prevent further progression and preserve the hair you have. Regrowth in scarred areas is unlikely. You have been prescribed a compounded topical solution with clobetasol, minoxidil, and finasteride for morning application. Please apply it as instructed to avoid facial hair growth. Noticeable changes may take about four months. This medication is not covered by insurance and will be mailed from a specialty pharmacy. You may also consider taking optional supplements like Vital Proteins collagen and Viviscal.  -ANDROGENETIC ALOPECIA:  Androgenetic alopecia is hair thinning at the temples and frontal hairline due to hormonal changes, which causes the hair follicles to shrink. There is potential for regrowth if you avoid pulling your hair tightly. This condition is also included in the treatment with the compounded topical solution containing clobetasol, minoxidil, and finasteride. Please avoid tight hairstyles to prevent further hair loss.  INSTRUCTIONS:  Please follow up in four months to assess the effectiveness of the treatment. If you have any concerns or experience any side effects, contact our office.       Important Information  Due to recent changes in healthcare laws, you may see results of your pathology and/or laboratory studies on MyChart before the doctors have had  a chance to review them. We understand that in some cases there may be results that are confusing or concerning to you. Please understand that not all results are received at the same time and often the doctors may need to interpret multiple results in order to provide you with the best plan of care or course of treatment. Therefore, we ask that you please give us  2 business days to thoroughly review all your results before contacting the office for clarification. Should we see a critical lab result, you will be contacted sooner.   If You Need Anything After Your Visit  If you have any questions or concerns for your doctor, please call our main line at [phone] If no one answers, please leave a voicemail as directed and we will return your call as soon as possible. Messages left after 4 pm will be answered the following business day.   You may also send us  a message via MyChart. We typically respond to MyChart messages within 1-2 business days.  For prescription refills, please ask your pharmacy to contact our office. Our fax number is [phone].  If you have an urgent issue when the clinic is closed that cannot wait until the next business day, you can page your doctor at the number below.    Please note that while we do our best to be available for urgent issues outside of office hours, we are not available 24/7.   If you have an urgent issue and are unable to reach us , you may choose to seek medical care at your doctor's office, retail clinic, urgent care center, or emergency room.  If you have a medical emergency, please immediately call  911 or go to the emergency department. In the event of inclement weather, please call our main line at [phone] for an update on the status of any delays or closures.  Dermatology Medication Tips: Please keep the boxes that topical medications come in in order to help keep track of the instructions about where and how to use these. Pharmacies  typically print the medication instructions only on the boxes and not directly on the medication tubes.   If your medication is too expensive, please contact our office at [phone] or send us  a message through MyChart.   We are unable to tell what your co-pay for medications will be in advance as this is different depending on your insurance coverage. However, we may be able to find a substitute medication at lower cost or fill out paperwork to get insurance to cover a needed medication.   If a prior authorization is required to get your medication covered by your insurance company, please allow us  1-2 business days to complete this process.  Drug prices often vary depending on where the prescription is filled and some pharmacies may offer cheaper prices.  The website www.goodrx.com contains coupons for medications through different pharmacies. The prices here do not account for what the cost may be with help from insurance (it may be cheaper with your insurance), but the website can give you the price if you did not use any insurance.  - You can print the associated coupon and take it with your prescription to the pharmacy.  - You may also stop by our office during regular business hours and pick up a GoodRx coupon card.  - If you need your prescription sent electronically to a different pharmacy, notify our office through [facility] or by phone at [phone]

## 2024-02-21 ENCOUNTER — Ambulatory Visit (INDEPENDENT_AMBULATORY_CARE_PROVIDER_SITE_OTHER): Admitting: Primary Care

## 2024-02-29 DIAGNOSIS — Z1211 Encounter for screening for malignant neoplasm of colon: Secondary | ICD-10-CM | POA: Diagnosis not present

## 2024-03-06 LAB — COLOGUARD: COLOGUARD: NEGATIVE

## 2024-03-22 DIAGNOSIS — H3322 Serous retinal detachment, left eye: Secondary | ICD-10-CM | POA: Diagnosis not present

## 2024-03-22 DIAGNOSIS — H2513 Age-related nuclear cataract, bilateral: Secondary | ICD-10-CM | POA: Diagnosis not present

## 2024-04-10 ENCOUNTER — Encounter (INDEPENDENT_AMBULATORY_CARE_PROVIDER_SITE_OTHER): Payer: Self-pay | Admitting: Primary Care

## 2024-04-10 ENCOUNTER — Ambulatory Visit (INDEPENDENT_AMBULATORY_CARE_PROVIDER_SITE_OTHER): Payer: Self-pay | Admitting: Primary Care

## 2024-04-10 VITALS — BP 158/91 | HR 75 | Resp 16 | Wt 238.0 lb

## 2024-04-10 DIAGNOSIS — J321 Chronic frontal sinusitis: Secondary | ICD-10-CM

## 2024-04-12 ENCOUNTER — Encounter (INDEPENDENT_AMBULATORY_CARE_PROVIDER_SITE_OTHER): Payer: Self-pay | Admitting: Primary Care

## 2024-04-12 MED ORDER — KETOROLAC TROMETHAMINE 60 MG/2ML IM SOLN: 60.0000 mg | Freq: Once | INTRAMUSCULAR | Status: AC

## 2024-04-12 NOTE — Progress Notes (Unsigned)
 [facility]  Heather Abbott, is a 68 y.o. female  RDW:[id number]  [id number]  DOB - [birth year]    Subjective:   Heather Abbott is a 68 y.o. female here today for an acute visit.  HPI  No problems updated.  Comprehensive ROS Pertinent positive and negative noted in HPI   Allergies  Allergen Reactions   Baclofen Nausea Only   Tramadol Hcl Nausea Only    Past Medical History:  Diagnosis Date   Hyperlipidemia    Hypertension     Current Outpatient Medications on File Prior to Visit  Medication Sig Dispense Refill   albuterol (VENTOLIN HFA) 108 (90 Base) MCG/ACT inhaler Inhale into the lungs as needed.     Cholecalciferol (VITAMIN D3) 50 MCG (2000 UT) capsule Take 1 capsule (2,000 Units total) by mouth daily. 90 capsule 1   ergocalciferol  (VITAMIN D2) 1.25 MG (50000 UT) capsule Take 1 capsule (50,000 Units total) by mouth once a week. 10 capsule 0   fluticasone (FLONASE ALLERGY RELIEF) 50 MCG/ACT nasal spray Place 2 sprays into both nostrils daily.     olmesartan-hydrochlorothiazide (BENICAR HCT) 40-25 MG tablet Take 1 tablet by mouth daily.  1   Safety Seal Miscellaneous MISC Minoxidil 8% Clobetasol 0.05% Finasteride 1% solution - apply to the affected areas of the scalp QAM. 30 mL 5   simvastatin (ZOCOR) 20 MG tablet Take 20 mg by mouth at bedtime.     verapamil  (VERELAN  PM) 360 MG 24 hr capsule Take 1 capsule (360 mg total) by mouth at bedtime. 90 capsule 3   No current facility-administered medications on file prior to visit.   Health Maintenance  Topic Date Due   Hepatitis C Screening  Never done   Zoster (Shingles) Vaccine (1 of 2) Never done   Osteoporosis screening with Bone Density Scan  Never done   Flu Shot  12/24/2023   COVID-19 Vaccine (4 - 2025-26 season) 01/24/2024   Pneumococcal Vaccine for age over 4 (1 of 2 - PCV) 01/05/2025*   Breast Cancer Screening  07/22/2025   Cologuard (Stool DNA test)  03/01/2027   DTaP/Tdap/Td vaccine (3  - Td or Tdap) 07/01/2033   Meningitis B Vaccine  Aged Out  *Topic was postponed. The date shown is not the original due date.    Objective:   Vitals:   04/10/24 1629  BP: (!) 158/91  Pulse: 75  Resp: 16  SpO2: 97%  Weight: 238 lb (108 kg)   BP Readings from Last 3 Encounters:  04/10/24 (!) 158/91  02/15/24 (!) 142/80  01/06/24 (!) 161/89      Physical Exam Vitals reviewed.  Constitutional:      Appearance: Normal appearance. She is obese.  HENT:     Head: Normocephalic.     Right Ear: Tympanic membrane, ear canal and external ear normal.     Left Ear: Tympanic membrane, ear canal and external ear normal.     Nose: Nose normal.     Mouth/Throat:     Mouth: Mucous membranes are moist.  Eyes:     Extraocular Movements: Extraocular movements intact.     Pupils: Pupils are equal, round, and reactive to light.  Cardiovascular:     Rate and Rhythm: Normal rate and regular rhythm.  Pulmonary:     Effort: Pulmonary effort is normal.     Breath sounds: Normal breath sounds.  Abdominal:     General: Bowel sounds are normal.     Palpations: Abdomen is soft.  Musculoskeletal:  Cervical back: Normal range of motion and neck supple. Tenderness present.  Skin:    General: Skin is warm and dry.  Neurological:     Mental Status: She is alert and oriented to person, place, and time.  Psychiatric:        Mood and Affect: Mood normal.        Behavior: Behavior normal.        Thought Content: Thought content normal.     Assessment & Plan   There are no diagnoses linked to this encounter.   Patient have been counseled extensively about nutrition and exercise. Other issues discussed during this visit include: low cholesterol diet, weight control and daily exercise, foot care, annual eye examinations at Ophthalmology, importance of adherence with medications and regular follow-up. We also discussed long term complications of uncontrolled diabetes and hypertension.   No  follow-ups on file.  The patient was given clear instructions to go to ER or return to medical center if symptoms don't improve, worsen or new problems develop. The patient verbalized understanding. The patient was told to call to get lab results if they haven't heard anything in the next week.   This note has been created with Education officer, environmental. Any transcriptional errors are unintentional.   Heather SHAUNNA Bohr, NP 04/12/2024, 3:02 PM

## 2024-04-25 ENCOUNTER — Encounter (INDEPENDENT_AMBULATORY_CARE_PROVIDER_SITE_OTHER): Payer: Self-pay | Admitting: Primary Care

## 2024-04-25 DIAGNOSIS — R12 Heartburn: Secondary | ICD-10-CM

## 2024-04-25 NOTE — Telephone Encounter (Signed)
 Will forward to provider

## 2024-04-28 ENCOUNTER — Encounter (INDEPENDENT_AMBULATORY_CARE_PROVIDER_SITE_OTHER): Payer: Self-pay | Admitting: Primary Care

## 2024-04-28 NOTE — Telephone Encounter (Signed)
 Will forward to provider

## 2024-05-01 MED ORDER — OMEPRAZOLE 20 MG PO CPDR: 20.0000 mg | DELAYED_RELEASE_CAPSULE | Freq: Every day | ORAL | 3 refills | Status: AC

## 2024-07-19 ENCOUNTER — Ambulatory Visit: Admitting: Dermatology
